# Patient Record
Sex: Female | Born: 1970 | Race: White | Hispanic: No | Marital: Married | Smoking: Former smoker
Health system: Southern US, Community
[De-identification: ages and names within clinical notes are randomized; demographics above are authoritative.]

## PROBLEM LIST (undated history)

## (undated) DIAGNOSIS — K219 Gastro-esophageal reflux disease without esophagitis: Secondary | ICD-10-CM

## (undated) DIAGNOSIS — R519 Headache, unspecified: Secondary | ICD-10-CM

## (undated) DIAGNOSIS — R079 Chest pain, unspecified: Secondary | ICD-10-CM

## (undated) DIAGNOSIS — F419 Anxiety disorder, unspecified: Secondary | ICD-10-CM

## (undated) DIAGNOSIS — R51 Headache: Principal | ICD-10-CM

## (undated) DIAGNOSIS — R0602 Shortness of breath: Secondary | ICD-10-CM

## (undated) HISTORY — DX: Gastro-esophageal reflux disease without esophagitis: K21.9

## (undated) HISTORY — PX: ABDOMINAL HYSTERECTOMY: SHX81

## (undated) HISTORY — DX: Headache: R51

## (undated) HISTORY — DX: Chest pain, unspecified: R07.9

## (undated) HISTORY — DX: Anxiety disorder, unspecified: F41.9

## (undated) HISTORY — DX: Shortness of breath: R06.02

## (undated) HISTORY — DX: Headache, unspecified: R51.9

---

## 1997-06-11 ENCOUNTER — Emergency Department (HOSPITAL_COMMUNITY): Admission: EM | Admit: 1997-06-11 | Discharge: 1997-06-11 | Payer: Self-pay | Admitting: Emergency Medicine

## 1997-07-02 ENCOUNTER — Other Ambulatory Visit: Admission: RE | Admit: 1997-07-02 | Discharge: 1997-07-02 | Payer: Self-pay | Admitting: Gynecology

## 1997-07-06 ENCOUNTER — Other Ambulatory Visit: Admission: RE | Admit: 1997-07-06 | Discharge: 1997-07-06 | Payer: Self-pay | Admitting: Obstetrics and Gynecology

## 1997-09-28 ENCOUNTER — Ambulatory Visit (HOSPITAL_COMMUNITY): Admission: RE | Admit: 1997-09-28 | Discharge: 1997-09-28 | Payer: Self-pay | Admitting: Obstetrics and Gynecology

## 1998-06-16 ENCOUNTER — Other Ambulatory Visit: Admission: RE | Admit: 1998-06-16 | Discharge: 1998-06-16 | Payer: Self-pay | Admitting: *Deleted

## 1998-09-14 ENCOUNTER — Observation Stay (HOSPITAL_COMMUNITY): Admission: RE | Admit: 1998-09-14 | Discharge: 1998-09-15 | Payer: Self-pay | Admitting: *Deleted

## 1998-09-14 ENCOUNTER — Encounter (INDEPENDENT_AMBULATORY_CARE_PROVIDER_SITE_OTHER): Payer: Self-pay | Admitting: Specialist

## 1999-07-11 ENCOUNTER — Other Ambulatory Visit: Admission: RE | Admit: 1999-07-11 | Discharge: 1999-07-11 | Payer: Self-pay | Admitting: *Deleted

## 2000-05-09 ENCOUNTER — Ambulatory Visit (HOSPITAL_BASED_OUTPATIENT_CLINIC_OR_DEPARTMENT_OTHER): Admission: RE | Admit: 2000-05-09 | Discharge: 2000-05-09 | Payer: Self-pay | Admitting: Urology

## 2000-07-22 ENCOUNTER — Encounter: Payer: Self-pay | Admitting: Urology

## 2000-07-22 ENCOUNTER — Ambulatory Visit (HOSPITAL_COMMUNITY): Admission: RE | Admit: 2000-07-22 | Discharge: 2000-07-22 | Payer: Self-pay | Admitting: Urology

## 2000-08-01 ENCOUNTER — Ambulatory Visit (HOSPITAL_BASED_OUTPATIENT_CLINIC_OR_DEPARTMENT_OTHER): Admission: RE | Admit: 2000-08-01 | Discharge: 2000-08-01 | Payer: Self-pay | Admitting: Urology

## 2000-10-28 ENCOUNTER — Other Ambulatory Visit: Admission: RE | Admit: 2000-10-28 | Discharge: 2000-10-28 | Payer: Self-pay | Admitting: *Deleted

## 2002-09-02 ENCOUNTER — Other Ambulatory Visit: Admission: RE | Admit: 2002-09-02 | Discharge: 2002-09-02 | Payer: Self-pay | Admitting: *Deleted

## 2003-09-29 ENCOUNTER — Other Ambulatory Visit: Admission: RE | Admit: 2003-09-29 | Discharge: 2003-09-29 | Payer: Self-pay | Admitting: *Deleted

## 2004-09-25 ENCOUNTER — Other Ambulatory Visit: Admission: RE | Admit: 2004-09-25 | Discharge: 2004-09-25 | Payer: Self-pay | Admitting: *Deleted

## 2004-11-10 ENCOUNTER — Ambulatory Visit: Admission: RE | Admit: 2004-11-10 | Discharge: 2004-11-10 | Payer: Self-pay | Admitting: Gynecology

## 2005-01-30 ENCOUNTER — Ambulatory Visit: Admission: RE | Admit: 2005-01-30 | Discharge: 2005-01-30 | Payer: Self-pay | Admitting: Gynecology

## 2007-03-24 ENCOUNTER — Other Ambulatory Visit: Admission: RE | Admit: 2007-03-24 | Discharge: 2007-03-24 | Payer: Self-pay | Admitting: *Deleted

## 2007-04-01 ENCOUNTER — Encounter: Admission: RE | Admit: 2007-04-01 | Discharge: 2007-04-01 | Payer: Self-pay | Admitting: *Deleted

## 2007-08-29 ENCOUNTER — Encounter: Payer: Self-pay | Admitting: Gynecology

## 2007-08-29 ENCOUNTER — Other Ambulatory Visit: Admission: RE | Admit: 2007-08-29 | Discharge: 2007-08-29 | Payer: Self-pay | Admitting: Gynecology

## 2007-08-29 ENCOUNTER — Ambulatory Visit: Admission: RE | Admit: 2007-08-29 | Discharge: 2007-08-29 | Payer: Self-pay | Admitting: Gynecology

## 2007-11-21 ENCOUNTER — Ambulatory Visit (HOSPITAL_BASED_OUTPATIENT_CLINIC_OR_DEPARTMENT_OTHER): Admission: RE | Admit: 2007-11-21 | Discharge: 2007-11-21 | Payer: Self-pay | Admitting: Urology

## 2008-02-18 ENCOUNTER — Other Ambulatory Visit: Admission: RE | Admit: 2008-02-18 | Discharge: 2008-02-18 | Payer: Self-pay | Admitting: Gynecology

## 2008-02-18 ENCOUNTER — Encounter: Payer: Self-pay | Admitting: Gynecology

## 2008-02-18 ENCOUNTER — Ambulatory Visit: Admission: RE | Admit: 2008-02-18 | Discharge: 2008-02-18 | Payer: Self-pay | Admitting: Gynecology

## 2009-07-14 ENCOUNTER — Encounter: Admission: RE | Admit: 2009-07-14 | Discharge: 2009-07-14 | Payer: Self-pay | Admitting: Gynecology

## 2010-05-05 ENCOUNTER — Ambulatory Visit (HOSPITAL_BASED_OUTPATIENT_CLINIC_OR_DEPARTMENT_OTHER)
Admission: RE | Admit: 2010-05-05 | Discharge: 2010-05-06 | Disposition: A | Discharge status: Home / Self Care | Payer: 59 | Source: Ambulatory Visit | Attending: Urology | Admitting: Urology

## 2010-05-05 DIAGNOSIS — Z8541 Personal history of malignant neoplasm of cervix uteri: Secondary | ICD-10-CM | POA: Insufficient documentation

## 2010-05-05 DIAGNOSIS — N3010 Interstitial cystitis (chronic) without hematuria: Secondary | ICD-10-CM | POA: Insufficient documentation

## 2010-05-05 DIAGNOSIS — Z90710 Acquired absence of both cervix and uterus: Secondary | ICD-10-CM | POA: Insufficient documentation

## 2010-05-05 DIAGNOSIS — Z01812 Encounter for preprocedural laboratory examination: Secondary | ICD-10-CM | POA: Insufficient documentation

## 2010-05-05 DIAGNOSIS — N393 Stress incontinence (female) (male): Secondary | ICD-10-CM | POA: Insufficient documentation

## 2010-05-05 LAB — POCT I-STAT 4, (NA,K, GLUC, HGB,HCT)
Glucose, Bld: 87 mg/dL (ref 70–99)
HCT: 42 % (ref 36.0–46.0)
Hemoglobin: 14.3 g/dL (ref 12.0–15.0)
Potassium: 3.8 mEq/L (ref 3.5–5.1)
Sodium: 141 mEq/L (ref 135–145)

## 2010-05-16 NOTE — Op Note (Signed)
NAME:  Jenna Chapman, Jenna Chapman                ACCOUNT NO.:  [id number]  MEDICAL RECORD NO.:  [id number]          PATIENT TYPE:  LOCATION:                                 FACILITY:  PHYSICIAN:  Zeb Rawl I. Patsi Sears, M.D. DATE OF BIRTH:  DATE OF PROCEDURE: DATE OF DISCHARGE:                              OPERATIVE REPORT   PREOPERATIVE DIAGNOSES:  Stress incontinence and interstitial cystitis.  POSTOPERATIVE DIAGNOSES:  Stress incontinence and interstitial cystitis.  OPERATIONS:  Cystourethroscopy, hydrodistention of the bladder (950 cc) with insertion of Pyridium and Marcaine in the bladder, injection of Marcaine into the subtrigonal space, and stress urinary incontinence treated with [place] pubovaginal sling.  SURGEON:  Shlok Raz I. Patsi Sears, M.D.  ANESTHESIA:  General LMA.  PREPARATION:  After appropriate preanesthesia, the patient was brought to the operating room and placed on the operating room table in the dorsal supine position where general LMA anesthesia was induced.  REVIEW OF HISTORY:  This 40 year old single female who has a combination of interstitial cystitis and stress urinary incontinence.  She is para 2- 2-0, wearing three pads per day.  She is status post hysterectomy in 2005 for cervical cancer.  She has had __________ in the past in 2009 for pelvic pain syndrome.  PROCEDURE IN DETAIL:  Cystoscopy was accomplished and note that the bladder holds 950 cc.  Cystoscopy again shows no evidence of severe ulcerations.  Marcaine and Pyridium were inserted into the bladder. Marcaine plain, 10 cc 0.25 was injected into the bladder base.  Following this, the patient undergoes injection of Marcaine with epinephrine diluted with normal saline, for hydrodissection, in the midline of the urethra, which was marked with a blue pen.  The bladder neck was marked with a blue pen as well to ensure that the sling was placed in the mid urethral area.  Again, two areas were marked  two fingerbreadths above and lateral to the pubic symphysis.  Incisions were made both in the area marked above the pubic symphysis, and in the mid urethra.  Subcutaneous tissue was dissected with the Strully scissors in the periurethral area.  Using the [facility] sling needles, the needles were placed in the retropubic fashion.  Cystoscopy reveals that the right needle was into the bladder, and this was removed, replaced, and repeat cystoscopy shows excellent placement with both the 30-degrees and the 70- degrees lenses.  Following this, the [place] sling was placed retropubically without difficulty.  A right-angle clamp was used for tensioning purposes, and the sleeves were removed, and the mesh was then cut in the subcutaneous area.  The patient tolerated the procedure well. The vaginal incision was closed with running 3-0 Vicryl suture.  The patient tolerated the procedure well.  Foley catheter remains, and vaginal packing was placed.  She was awakened and taken to the recovery room in good condition.  She was given 30 mg of IV Toradol at the end of procedure.  She received a B and O suppository at the beginning of procedure.     Laporsha Grealish I. Patsi Sears, M.D.     SIT/MEDQ  D:  05/05/2010  T:  05/05/2010  Job:  [id number]  cc:   Billee Cashing, M.D.  Electronically Signed by Jethro Bolus M.D. on 05/16/2010 05:10:27 PM

## 2010-05-16 NOTE — Consult Note (Signed)
NAMEWILENA, Jenna Chapman              ACCOUNT NO.:  [id number]   MEDICAL RECORD NO.:  [id number]          PATIENT TYPE:  OUT   LOCATION:  GYN                          FACILITY:  [facility]   PHYSICIAN:  De Blanch, M.D.DATE OF BIRTH:  August 20, 1970   DATE OF CONSULTATION:  08/29/2007  DATE OF DISCHARGE:  08/29/2007                                 CONSULTATION   CHIEF COMPLAINT:  Bladder dysfunction, pelvic pain, constipation.   HISTORY OF PRESENT ILLNESS:  This 40 year old white female returns  having last been seen in our clinic in January 2007.  The reason she has  not returned is unclear.  Today she has number of complaints including  1. Bladder dysfunction.  She does not have good bladder sensation and      does not feel like she empties her bladder completely.  She denies      any recent urinary tract infections or any pyelonephritis.  2. Constipation.  The patient reports that she has a bowel movement      approximately once a month.  She has not been taking any laxatives      and does not seem to be particularly concerned about this problem.  3. Pelvic pain.  The patient reports that she has discomfort and pain      when she has intercourse.  She comes accompanied by her female      partner, who confirms.   HISTORY OF PRESENT ILLNESS:  The patient underwent a radical  trachelectomy and bilateral pelvic lymphadenectomy December 2006.  She  had an adenocarcinoma of the cervical stump.  Final pathology showed no  residual disease and all lymph nodes were negative.  The patient had an  uncomplicated postoperative course and her suprapubic catheter was  removed approximately a week after she began clamp and release.   PAST MEDICAL HISTORY:  Medical illnesses - none.   CURRENT MEDICATIONS:  None.   DRUG ALLERGIES:  None.   PAST SURGICAL HISTORY:  1. Tubal ligation.  2. Diagnostic laparoscopy.  3. Hysterectomy.  4. Radical trachelectomy.  5. Pelvic lymphadenectomy.   SOCIAL  HISTORY:  The patient is a Production designer, theatre/television/film at [employer].  She lives  with her mother.   FAMILY HISTORY:  Sister with cervical cancer.   REVIEW OF SYSTEMS:  A 10-point comprehensive review of systems negative  except as noted above.   PHYSICAL EXAMINATION:  VITAL SIGNS:  Weight 172 pounds, blood pressure  120/70, pulse 80, respiratory 20.  GENERAL:  Patient is a healthy white female in no acute distress.  HEENT:  Negative.  NECK:  Supple without thyromegaly.  There is no supraclavicular or  inguinal adenopathy.  ABDOMEN:  Soft, nontender.  No masses, organomegaly, ascites or hernias  noted.  PELVIC:  EG, BUS, vagina, bladder and urethra are normal.  Vagina  appears very normal but slightly atrophic.  No lesions are noted.  Bimanual and rectovaginal exam reveal no mass, induration or nodularity.   IMPRESSION:  Adenocarcinoma of the cervix, clinically free of disease.  Pap smears were obtained.   Bladder dysfunction, most likely secondary to her radical  hysterectomy.  She has previous seen Dr. Brunilda Payor and I would suggest the patient return to  see him for further evaluation.  After evaluation, it may be necessary  that she learn how to perform intermittent self-catheterization.  On the  other hand, there may be medications that Dr. Brunilda Payor could prescribe to  help her with her bladder function.   Chronic constipation.  It is amazing to me that the patient admits that  she only has a bowel movement once a month.  We have advised her to  increase fiber and fluids in her diet and also to use a mild laxative  such as MiraLax or Milk of Magnesia and to not but more than 4-5 days  ago between having a bowel movement.  Certainly this could be  contributing significantly to her abdominal and pelvic discomfort.   She does have some vaginal atrophy and therefore is given a prescription  for Premarin vaginal cream.  In addition, she says she is having severe  hot flashes and she is therefore given a  prescription for Premarin 0.9  mg p.o. daily.   The patient will return to see me in 6 months for continuing follow-up.      De Blanch, M.D.  Electronically Signed     DC/MEDQ  D:  09/01/2007  T:  09/01/2007  Job:  [id number]   cc:   Telford Nab, R.N.  [address]  [place], [place] [address]   Lindaann Slough, M.D.  Fax: [phone]   Leatha Gilding. Mezer, M.D.  Fax: [phone]

## 2010-05-16 NOTE — Op Note (Signed)
Jenna Chapman, Jenna Chapman              ACCOUNT NO.:  [id number]   MEDICAL RECORD NO.:  [id number]          PATIENT TYPE:  AMB   LOCATION:  [facility]                         FACILITY:  [facility]   PHYSICIAN:  Lindaann Slough, M.D.  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   PROCEDURE:  Cystoscopy HOG urethral dilation and bladder instillation of  Marcaine and Pyridium.   SURGEON:  Dr. Brunilda Payor.   ANESTHESIA:  General.   The patient was identified by her wrist band.  Proper time out was then  taken.   INDICATION:  The patient is a 40 year old female who had been  complaining of frequency, straining on urination, suprapubic discomfort,  sensation of incomplete emptying of the bladder and fullness in the  suprapubic area.  She was treated with Flomax, Estrace cream, and she  has not had any improvement.  She needs cystoscopy for further  evaluation.   Under general anesthesia, the patient was prepped and draped and placed  in the dorsal lithotomy position.  A panendoscope was inserted in the  bladder.  The bladder mucosa is normal.  There is no stone or tumor in  the bladder.  The ureteral orifices are in normal position and shape.  There is no evidence of submucosal hemorrhage.  The bladder was then  distended for about 5 minutes under 36 cm of water pressure.  The  bladder capacity is about 800 mL.  The cystoscope was then removed.  The  urethra was dilated up to #32-French.  Then, 400 mg of Pyridium in 15 mL  of 0.5% Marcaine were instilled in the bladder.   The patient tolerated the procedure well and left the OR in satisfactory  condition to post-anesthesia care unit.      Lindaann Slough, M.D.  Electronically Signed     MN/MEDQ  D:  11/21/2007  T:  11/21/2007  Job:   [id number]

## 2010-05-16 NOTE — Consult Note (Signed)
[name], Chapman              ACCOUNT NO.:  [id number]   MEDICAL RECORD NO.:  [id number]          PATIENT TYPE:  OUT   LOCATION:  GYN                          FACILITY:  [facility]   PHYSICIAN:  De Blanch, M.D.DATE OF BIRTH:  1970-10-24   DATE OF CONSULTATION:  02/18/2008  DATE OF DISCHARGE:                                 CONSULTATION   REFERRING PHYSICIAN:  Lindaann Slough, M.D.   CHIEF COMPLAINT:  Cervical cancer, continuing bladder dysfunction,  menopausal symptoms.   INTERVAL HISTORY:  The patient returns today as regularly scheduled for  followup.  At her last visit we undertook several interventions.  I  prescribed Premarin cream, which is helping the patient with vaginal  function.  I also gave her a Premarin prescription for 0.9 mg.  She  reports she is still having hot flashes, night sweats and irritability.   The patient is less constipated with a more aggressive dietary regimen,  but still claims she has a bowel movement only about 3 times a month.   With regard to her bladder dysfunction.  She tells me she saw Dr. Brunilda Payor  who did urethral dilatation.  Nonetheless, she continues to feel as if  her bladder is not emptying.  She indicates that Dr. Brunilda Payor evaluated her  with a bladder scan, and showed that she was having good emptying of the  bladder with no residual.  The patient has had no urinary tract  infections in the intervening 6 months.   HISTORY OF PRESENT ILLNESS:  The patient underwent a radical  trachelectomy and bilateral pelvic lymphadenectomy December 2006, for  adenocarcinoma of the cervical stump (the patient previously had a  supracervical hysterectomy).  Final pathology showed no residual disease  and all lymph nodes were negative.   PAST MEDICAL HISTORY:  Medical illnesses none.   CURRENT MEDICATIONS:  1. Premarin 0.9 mg daily.  2. Premarin cream.   DRUG ALLERGIES:  None.   PAST SURGICAL HISTORY:  1. Tubal ligation.  2. Diagnostic  laparoscopy.  3. Hysterectomy with bilateral salpingo-oophorectomy.  4. Radical trachelectomy.  5. Pelvic lymphadenectomy.   SOCIAL HISTORY:  The patient is a Production designer, theatre/television/film at [employer].  She lives  with her mother.   FAMILY HISTORY:  Sister with cervical cancer.   REVIEW OF SYSTEMS:  A 10-point comprehensive review of systems negative,  except as noted above.   PHYSICAL EXAMINATION:  Weight 190 pounds.  GENERAL:  The patient is a pleasant white female in no acute distress.  HEENT:  Negative.  NECK:  Supple without thyromegaly.  There is no supraclavicular or  inguinal adenopathy.  ABDOMEN:  Soft and nontender.  No mass, organomegaly, ascites or hernias  noted.  PELVIC EXAM:  EG BUS, vagina, bladder and urethra are normal.  Cervix and uterus are surgically absent.  Adnexa without masses.  Rectovaginal exam confirms.  EXTREMITIES:  Lower extremities without edema or varicosities.   IMPRESSION:  1. Cervical cancer.  No evidence of recurrent disease.  Pap smears      were obtained.  2. Continuing bladder dysfunction.  I have asked the patient to  return      to see Dr. Brunilda Payor for further evaluation.  She does indicate she has      some stress incontinence and what sounds like urge incontinence as      well.  3. With regard to her menopausal symptoms, we will increase the dose      of her hormone replacement therapy to a dose of Premarin 1.25 mg      daily.  She will continue using Premarin vaginal cream for sexual      function.  4. The patient complains of some peripheral edema in her hands and      feet, and was given a prescription for Aldactazide 25 mg, to be      taken one tablet daily p.r.n. fluid retention.  She will return to      see me in 6 months for continuing followups.      De Blanch, M.D.  Electronically Signed     DC/MEDQ  D:  02/18/2008  T:  02/18/2008  Job:  [id number]   cc:   Telford Nab, R.N.  [address]  [place], [place] [address]    Lindaann Slough, M.D.  Fax: [phone]

## 2010-05-19 NOTE — Consult Note (Signed)
NAMELIBERTA, Jenna Chapman              ACCOUNT NO.:  [id number]   MEDICAL RECORD NO.:  [id number]          PATIENT TYPE:  OUT   LOCATION:  GYN                          [facility]ACILITY:  [facility]   PHYSICIAN:  De Blanch, M.D.DATE OF BIRTH:  1970-12-02   DATE OF CONSULTATION:  11/10/2004  DATE OF DISCHARGE:                                   CONSULTATION   REFERRING PHYSICIAN:  Almedia Balls. Fore, M.D.   CHIEF COMPLAINT:  Cervical cancer.   HISTORY OF PRESENT ILLNESS:  The patient recently was found to have a Pap  smear showing atypical glandular cells and subsequently underwent a LEEP  procedure on October 20, 2004. She was found to have extensive  adenocarcinoma in both the anterior and posterior fragments of the cervix  and involvement of the endocervical margin. The depth of invasion was only  approximately 1 mm, but the extent of horizontal spread was at least 8 mm.  There was some villoglandular architecture and endometrioid appearance.  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   The patient has an interesting past gynecological history in that she  underwent a laparoscopically assisted super cervical hysterectomy and  bilateral salpingo-oophorectomy on September 14, 1998 for treatment of  severe pelvic pain, dyspareunia with findings of endometriosis. The cervix  was left in situ at the patient's request. Prior to that time, she had not  had abnormal Pap smears.   PAST MEDICAL HISTORY:  Medical illnesses:  None.   PAST SURGICAL HISTORY:  Laparoscopically assisted super cervical vaginal  hysterectomy and bilateral salpingo-oophorectomy, bilateral tubal ligation.   DRUG ALLERGIES:  None.   CURRENT MEDICATIONS:  Estradiol.   FAMILY HISTORY:  The patient has a sister and two aunts who apparently had  cervix cancer.   SOCIAL HISTORY:  The patient is divorced. She comes accompanied by her  boyfriend today. She smokes one pack per day and  works as a Production designer, theatre/television/film at [employer].   OBSTETRICAL HISTORY:  Gravida 2. She has 62 and 3 year old children.   REVIEW OF SYSTEMS:  Ten-point comprehensive review of systems negative  except for notation that she does have some vaginal discharge and spotting  at the present time. She also notes that the Estradiol is not really  sufficiently holding her hot flushes and night sweats in check.   PHYSICAL EXAMINATION:  VITAL SIGNS:  Height 5 foot 7, weight 158 pounds.  Blood pressure 118/74, pulse 92.  GENERAL:  The patient is a healthy, white female in no acute distress.  HEENT:  Negative.  NECK:  Supple without thyromegaly. There is no supraclavicular or inguinal  adenopathy.  ABDOMEN:  Soft and nontender with no masses, organomegaly, ascites, or  hernias noted.  PELVIC:  EGBUS, vagina, bladder, and urethra are normal. The cervix is  healing following a LEEP procedure. No gross lesions are noted. On bimanual  examination, the cervix is very mobile. I do not feel any paravaginal or  parametrial disease. Pelvic side walls also appear and palpate normally.  EXTREMITIES:  Lower extremities without edema or varicosities.  IMPRESSION:  Well-differentiated endocervical adenocarcinoma which is quite  extensive although superficially invasive. I had a lengthy discussion with  the patient and her boyfriend regarding management options which include  either primary radiation therapy or primary radical surgery. After  discussing both and emphasizing that both have equally good cancer outcomes,  the patient wishes to proceed with a radical trachelectomy and pelvic  lymphadenectomy. The risks of this surgery including hemorrhage, infection,  injury to adjacent viscera, and fistulization were discussed. We also  pointed out the probability of some bladder dysfunction following surgery  and the need for a suprapubic catheter postoperatively. The patient  expressed an understanding of all  of these risks, and all of her questions  were answered. I would like to have her cervix heal adequately before  undertaking the radial hysterectomy. In looking at schedules, it appears my  next available day in [place] is December 19. On the other hand, the  patient would prefer to have surgery at [facility] on December 12. We  will make those arrangements.      De Blanch, M.D.  Electronically Signed     DC/MEDQ  D:  11/10/2004  T:  11/11/2004  Job:  [id number]   cc:   Almedia Balls. Randell Patient, M.D.  Fax: [phone]   Telford Nab, R.N.  [address]  [place], [place] [address]

## 2010-05-19 NOTE — Op Note (Signed)
[place]  Patient:    Jenna Chapman, Jenna Chapman                     MRN: [id number] Proc. Date: 05/09/00 Adm. Date:  [id number] Attending:  Lindaann Slough CC:         Almedia Balls. Randell Patient, M.D.   Operative Report  PREOPERATIVE DIAGNOSIS:  Meatal stenosis.  POSTOPERATIVE DIAGNOSIS:  Meatal stenosis.  PROCEDURE:  Cystoscopy urethral dilation.  SURGEON:  Lindaann Slough, M.D.  ANESTHESIA:  General.  INDICATIONS:  The patient is a 40 year old female who has been complaining on and off for the past 2 years of frequency, ______ pain, and pain during intercourse.  She was treated with antibiotics without any improvement.  She is scheduled today for cystoscopy.  DESCRIPTION OF PROCEDURE:  Under general anesthesia, the patient was prepped and draped, and placed in the dorsal lithotomy position.  A #21 Wappler cystoscope was inserted in the bladder.  The bladder mucosa is normal.  There is no stone or tumor in the bladder.  The ureteral orifices are in normal position and shape with clear efflux.  The bladder was then emptied.  There was no evidence of submucosal hemorrhage after refilling the bladder.  The bladder was kept dilated for 10 minutes with 400 cc of fluid.  The cystoscope was then removed.  The urethra was dilated with #32 [place].  The patient tolerated the procedure well and left the OR in satisfactory condition to post anesthesia care unit. DD:  05/09/00 TD:  05/09/00 Job: [id number]

## 2010-05-19 NOTE — Op Note (Signed)
[place]  Patient:    Jenna Chapman, Jenna Chapman                     MRN: [id number] Proc. Date: 08/01/00 Adm. Date:  [id number] Disc. Date: [id number] Attending:  Lindaann Slough CC:         Almedia Balls. Randell Patient, M.D.   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  OPERATION:  Cystoscopy and urethral dilation.  SURGEON:  Lindaann Slough, M.D.  ANESTHESIA:  General  INDICATION FOR PROCEDURE:  The patient is a 40 year old female who has been complaining of dysuria and pain during intercourse.  She was treated with antibiotics without any improvement.  VCUG showed no diverticula or reflux. She was evaluated by her gynecologist, who found no evidence of gynecologic pathology.  She is scheduled today for cystoscopy.  DESCRIPTION OF PROCEDURE:  Under general anesthesia, the patient was prepped and draped and placed in the dorsal lithotomy position.  A 22 Wappler cystoscope was inserted in the bladder.  The bladder mucosa is normal.  There is no stone or tumor in the bladder.  The ureteral orifices are in normal position and shape with clear efflux.  There is evidence of trigonitis.  There was no evidence of submucosal hemorrhage.  The cystoscope was then removed. The urethra was dilated with #32 [place].  Bimanual examination showed no evidence of pelvic mass.  The cervix is firm and in the midline.  The patient tolerated the procedure well and left the operating room in satisfactory condition to postanesthesia care unit. DD:  08/01/00 TD:  08/01/00 Job: [id number]

## 2010-05-19 NOTE — Consult Note (Signed)
Jenna Chapman, Jenna Chapman              ACCOUNT NO.:  [id number]   MEDICAL RECORD NO.:  [id number]          PATIENT TYPE:  OUT   LOCATION:  GYN                          FACILITY:  [facility]   PHYSICIAN:  De Blanch, M.D.DATE OF BIRTH:  1970-02-02   DATE OF CONSULTATION:  01/30/2005  DATE OF DISCHARGE:                                   CONSULTATION   CHIEF COMPLAINT:  Postoperative followup.   REASON FOR EVALUATION:  This 40 year old white female returns for followup,  having undergone a radical trachelectomy and bilateral pelvis  lymphadenectomy, December 12, 2004, for an adenocarcinoma of the cervical  stump.  Final pathology showed no residual disease and all lymph nodes were  negative.  The patient had an uncomplicated postoperative course.  Her  suprapubic catheter was removed approximately a week after she began clamp-  and-release.   Today she notes that she is having some urinary urgency.  She does have good  sensation of her bladder.  She feels she has some hesitancy as well.  She  denies any pelvic pain or pressure, functional status is increasing and she  has already returned to work.   PHYSICAL EXAMINATION:  VITAL SIGNS:  Weight 161 pounds.  GENERAL:  The patient is a healthy white female in no acute distress.  HEENT:  Negative.  NECK:  Supple without thyromegaly.  ABDOMEN:  Soft and nontender.  No masses, organomegaly, ascites or hernias  are noted.  Pfannenstiel skin incision is well-healed.  PELVIC:  EG/BUS, vagina, and urethra are normal.  Cervix and uterus are  surgically absent.  The vaginal cuff is healing nicely.  Bimanual and  rectovaginal exam reveal no masses, induration or nodularity.  There is a  minimal amount of postoperative induration.   IMPRESSION:  Good postoperative recovery.  I reviewed the patient's  pathology report with her and would not recommend any additional therapy.   The patient does note she is having a considerable number of hot  flushes.  She has previously had a bilateral salpingo-oophorectomy and is currently  taking estradiol 4 mg daily.  I therefore suggested she increase her  estradiol dose to 6 mg daily.   She was also given a prescription for Detrol LA one tablet daily for the  next month to see if this will help with her urinary incontinence.   She will return to see Dr. Jeanine Luz in 3 months and return to see [place] in 6  months.      De Blanch, M.D.  Electronically Signed     DC/MEDQ  D:  01/30/2005  T:  01/31/2005  Job:  [id number]   cc:   Almedia Balls. Randell Patient, M.D.  Fax: [phone]   Telford Nab, R.N.  [address]  [place], [place] [address]

## 2010-06-22 ENCOUNTER — Ambulatory Visit (HOSPITAL_BASED_OUTPATIENT_CLINIC_OR_DEPARTMENT_OTHER)
Admission: RE | Admit: 2010-06-22 | Discharge: 2010-06-22 | Disposition: A | Discharge status: Home / Self Care | Payer: 59 | Source: Ambulatory Visit | Attending: Urology | Admitting: Urology

## 2010-06-22 DIAGNOSIS — N3010 Interstitial cystitis (chronic) without hematuria: Secondary | ICD-10-CM | POA: Insufficient documentation

## 2010-06-22 DIAGNOSIS — Z01812 Encounter for preprocedural laboratory examination: Secondary | ICD-10-CM | POA: Insufficient documentation

## 2010-06-22 DIAGNOSIS — R339 Retention of urine, unspecified: Secondary | ICD-10-CM | POA: Insufficient documentation

## 2010-06-22 LAB — POCT HEMOGLOBIN-HEMACUE: Hemoglobin: 13.8 g/dL (ref 12.0–15.0)

## 2010-06-23 NOTE — Op Note (Signed)
  NAME:  Jenna Chapman, Jenna Chapman                ACCOUNT NO.:  [id number]  MEDICAL RECORD NO.:  [id number]  LOCATION:                                 FACILITY:  PHYSICIAN:  Pedrohenrique Mcconville I. Patsi Sears, M.D.DATE OF BIRTH:  1970-09-26  DATE OF PROCEDURE:  06/22/2010 DATE OF DISCHARGE:                              OPERATIVE REPORT   PREOPERATIVE DIAGNOSIS:  Urinary retention post Lynx sling.  POSTOPERATIVE DIAGNOSIS:  Urinary retention post Lynx sling.  OPERATION:  Urethral exploration and incision of Lynx sling.  SURGEON:  Tyronica Truxillo I. Patsi Sears, M.D.  ANESTHESIA:  General LMA.  OPERATION:  After appropriate preanesthesia, the patient is brought to the operating room, placed on the operating table in a dorsal supine position where general LMA anesthesia was introduced.  She was then re- placed in dorsal lithotomy position where the pubis was prepped with Betadine solution and draped in usual fashion.  BRIEF HISTORY:  This 40 year old female is status post [place] pubovaginal sling with medical history of interstitial cystitis.  She has had urinary retention since her surgery, unresponsive to Rapaflo.  She is also unresponsive to Urecholine.  She is now for incision of her sling.  PROCEDURE:  With the patient in dorsal lithotomy position, the posterior weighted speculum was placed, and Foley catheter placed and bladder drained of fluid.  Review of the patient's operative note shows that she had usual placement of the Lynx sling in the periurethral position with excellent visualization of the sling.  Following injection of Marcaine and epinephrine into the surgical space, and marking of outline of the surgical incision, a 15-mm incision was made in mid urethra.  Subcutaneous tissue dissected.  The [place] sling was identified in the urethra, and the top of the sling appears to be smooth, but the bottom of sling appeared to be somewhat blanched.  The sling did appear to be tied against the urethra.  I  was able to dissect a right-angle clamp around the back of the sling and incised the sling, with immediate "pop," indicating a release of tension.  I then closed the incision using 2-0 Vicryl suture in single closure.  The patient received IV Toradol, awakened and taken to recovery room in good condition.    Michela Herst I. Patsi Sears, M.D.    SIT/MEDQ  D:  06/22/2010  T:  06/22/2010  Job:  [id number]  Electronically Signed by Jethro Bolus M.D. on 06/23/2010 01:19:55 PM

## 2010-07-31 ENCOUNTER — Other Ambulatory Visit: Payer: Self-pay | Admitting: Gynecology

## 2010-07-31 DIAGNOSIS — R928 Other abnormal and inconclusive findings on diagnostic imaging of breast: Secondary | ICD-10-CM

## 2010-08-11 ENCOUNTER — Ambulatory Visit
Admission: RE | Admit: 2010-08-11 | Discharge: 2010-08-11 | Disposition: A | Discharge status: Home / Self Care | Payer: 59 | Source: Ambulatory Visit | Attending: Gynecology | Admitting: Gynecology

## 2010-08-11 DIAGNOSIS — R928 Other abnormal and inconclusive findings on diagnostic imaging of breast: Secondary | ICD-10-CM

## 2010-08-17 ENCOUNTER — Ambulatory Visit (HOSPITAL_BASED_OUTPATIENT_CLINIC_OR_DEPARTMENT_OTHER)
Admission: RE | Admit: 2010-08-17 | Discharge: 2010-08-17 | Disposition: A | Discharge status: Home / Self Care | Payer: 59 | Source: Ambulatory Visit | Attending: Urology | Admitting: Urology

## 2010-08-17 DIAGNOSIS — N8111 Cystocele, midline: Secondary | ICD-10-CM | POA: Insufficient documentation

## 2010-08-17 DIAGNOSIS — J449 Chronic obstructive pulmonary disease, unspecified: Secondary | ICD-10-CM | POA: Insufficient documentation

## 2010-08-17 DIAGNOSIS — Z79899 Other long term (current) drug therapy: Secondary | ICD-10-CM | POA: Insufficient documentation

## 2010-08-17 DIAGNOSIS — R338 Other retention of urine: Secondary | ICD-10-CM | POA: Insufficient documentation

## 2010-08-17 DIAGNOSIS — Z8541 Personal history of malignant neoplasm of cervix uteri: Secondary | ICD-10-CM | POA: Insufficient documentation

## 2010-08-17 DIAGNOSIS — N393 Stress incontinence (female) (male): Secondary | ICD-10-CM | POA: Insufficient documentation

## 2010-08-17 DIAGNOSIS — J4489 Other specified chronic obstructive pulmonary disease: Secondary | ICD-10-CM | POA: Insufficient documentation

## 2010-08-17 DIAGNOSIS — K219 Gastro-esophageal reflux disease without esophagitis: Secondary | ICD-10-CM | POA: Insufficient documentation

## 2010-08-22 NOTE — Op Note (Signed)
  NAME:  Jenna Chapman, Jenna Chapman NO.:  [id number]  MEDICAL RECORD NO.:  [id number]  LOCATION:                                 FACILITY:  PHYSICIAN:  Weiland Tomich I. Patsi Sears, M.D. DATE OF BIRTH:  DATE OF PROCEDURE: DATE OF DISCHARGE:                              OPERATIVE REPORT   PREOPERATIVE DIAGNOSIS:  Urinary retention.  POSTOPERATIVE DIAGNOSIS:  Urinary retention.  OPERATION:  Midline vaginal incision and urethrolysis and incision of Lynx pubovaginal sling.  SURGEON:  Raksha Wolfgang I. Patsi Sears, MD  ANESTHESIA:  General LMA.  PREOPERATIVE PREPARATION:  After appropriate preanesthesia, the patient was brought to the operating room, placed on the operating table in the dorsal supine position where general LMA anesthesia was introduced.  She was then replaced in dorsal lithotomy position where the pubis was prepped with Betadine solution and draped in usual fashion.  REVIEW OF HISTORY:  The patient is a 40 year old female with urinary retention since [place] sling for urinary stress incontinence.  The patient has failed Rapaflo and Urecholine, has been on intermittent cath.  She is status post incision of Lynx sling in June 2012, but still could not void.  She is now for urethrolysis and urethral dilation.  PROCEDURE IN DETAIL:  The vaginal inspection was accomplished and she has normal extravasation.  There is a grade 1 cystocele present.  The urethra does not appear to be hypermobile, but does not appear to be fixed.  A 5 cc of Marcaine plus epinephrine was injected into the surgical site and incision was made, subcutaneous tissue dissected bilaterally.  Foley catheter remained in place.  Complete urethrolysis was then accomplished with incision of all tissue.  The sling had previously been incised.  Closure was accomplished with 2-0 Vicryl and urethral dilation was accomplished with a size 28-French without difficulty.  The patient tolerated procedure well.  There  was minimal bleeding noted.  The patient was awakened and taken to recovery room in good condition. She received 1 g of IV acetaminophen.     Irmalee Riemenschneider I. Patsi Sears, M.D.    SIT/MEDQ  D:  08/17/2010  T:  08/18/2010  Job:  [id number]  Electronically Signed by Jethro Bolus M.D. on 08/22/2010 05:01:53 PM

## 2010-10-03 LAB — POCT HEMOGLOBIN-HEMACUE: Hemoglobin: 14.5

## 2011-07-24 ENCOUNTER — Other Ambulatory Visit: Payer: Self-pay | Admitting: Gynecology

## 2011-07-24 DIAGNOSIS — N63 Unspecified lump in unspecified breast: Secondary | ICD-10-CM

## 2011-07-26 ENCOUNTER — Other Ambulatory Visit: Payer: 59

## 2011-07-31 ENCOUNTER — Ambulatory Visit
Admission: RE | Admit: 2011-07-31 | Discharge: 2011-07-31 | Disposition: A | Discharge status: Home / Self Care | Payer: 59 | Source: Ambulatory Visit | Attending: Gynecology | Admitting: Gynecology

## 2011-07-31 DIAGNOSIS — N63 Unspecified lump in unspecified breast: Secondary | ICD-10-CM

## 2012-06-27 ENCOUNTER — Emergency Department (HOSPITAL_COMMUNITY)
Admission: EM | Admit: 2012-06-27 | Discharge: 2012-06-27 | Disposition: A | Discharge status: Home / Self Care | Payer: 59 | Attending: Emergency Medicine | Admitting: Emergency Medicine

## 2012-06-27 ENCOUNTER — Emergency Department (HOSPITAL_COMMUNITY): Payer: 59

## 2012-06-27 ENCOUNTER — Encounter (HOSPITAL_COMMUNITY): Payer: Self-pay | Admitting: Emergency Medicine

## 2012-06-27 DIAGNOSIS — R110 Nausea: Secondary | ICD-10-CM | POA: Insufficient documentation

## 2012-06-27 DIAGNOSIS — F17200 Nicotine dependence, unspecified, uncomplicated: Secondary | ICD-10-CM | POA: Insufficient documentation

## 2012-06-27 DIAGNOSIS — I6200 Nontraumatic subdural hemorrhage, unspecified: Secondary | ICD-10-CM

## 2012-06-27 LAB — CBC WITH DIFFERENTIAL/PLATELET
Basophils Absolute: 0 10*3/uL (ref 0.0–0.1)
Eosinophils Relative: 0 % (ref 0–5)
Lymphocytes Relative: 15 % (ref 12–46)
Lymphs Abs: 2.3 10*3/uL (ref 0.7–4.0)
MCV: 92.1 fL (ref 78.0–100.0)
Neutro Abs: 12.5 10*3/uL — ABNORMAL HIGH (ref 1.7–7.7)
Platelets: 177 10*3/uL (ref 150–400)
RBC: 4.19 MIL/uL (ref 3.87–5.11)
RDW: 13.2 % (ref 11.5–15.5)
WBC: 15.4 10*3/uL — ABNORMAL HIGH (ref 4.0–10.5)

## 2012-06-27 LAB — POCT I-STAT, CHEM 8
Creatinine, Ser: 1 mg/dL (ref 0.50–1.10)
HCT: 39 % (ref 36.0–46.0)
Hemoglobin: 13.3 g/dL (ref 12.0–15.0)
Potassium: 4.5 mEq/L (ref 3.5–5.1)
Sodium: 142 mEq/L (ref 135–145)
TCO2: 25 mmol/L (ref 0–100)

## 2012-06-27 LAB — APTT: aPTT: 29 seconds (ref 24–37)

## 2012-06-27 LAB — PROTIME-INR
INR: 1.03 (ref 0.00–1.49)
Prothrombin Time: 13.3 seconds (ref 11.6–15.2)

## 2012-06-27 MED ORDER — HYDROCODONE-ACETAMINOPHEN 5-325 MG PO TABS: 1.0000 | ORAL_TABLET | Freq: Four times a day (QID) | ORAL | Status: DC | PRN

## 2012-06-27 MED ORDER — OXYCODONE-ACETAMINOPHEN 5-325 MG PO TABS: 1.0000 | ORAL_TABLET | Freq: Four times a day (QID) | ORAL | Status: DC | PRN

## 2012-06-27 NOTE — ED Provider Notes (Signed)
History    CSN: [id number] Arrival date & time 06/27/12  1550  First MD Initiated Contact with Patient 06/27/12 1626     Chief Complaint  Patient presents with  . Headache   (Consider location/radiation/quality/duration/timing/severity/associated sxs/prior Treatment) HPI Comments: Due to headache that started suddenly and has been maximum intensity since it started 7 days ago pt [facility] and Radiology called pt and told her to come to ED due to bleeding in the brain.  Patient is a 42 y.o. female presenting with headaches. The history is provided by the patient.  Headache Pain location:  Occipital Quality:  Sharp and stabbing Radiates to:  Does not radiate Severity currently:  10/10 Severity at highest:  10/10 Onset quality:  Sudden Duration:  7 days Timing:  Constant Progression:  Unchanged Chronicity:  New Similar to prior headaches: no   Context comment:  Nothing seems to make it worse Relieved by: goody's powders and then comes right back. Ineffective treatments:  None tried Associated symptoms: nausea   Associated symptoms: no abdominal pain, no blurred vision, no cough, no pain, no facial pain, no fever, no focal weakness, no loss of balance, no neck pain, no numbness, no paresthesias, no photophobia, no sinus pressure, no URI, no visual change, no vomiting and no weakness    History reviewed. No pertinent past medical history. Past Surgical History  Procedure Laterality Date  . Abdominal hysterectomy     No family history on file. History  Substance Use Topics  . Smoking status: Current Every Day Smoker  . Smokeless tobacco: Not on file  . Alcohol Use: No   OB History   Grav Para Term Preterm Abortions TAB SAB Ect Mult Living                 Review of Systems  Constitutional: Negative for fever.  HENT: Negative for neck pain and sinus pressure.   Eyes: Negative for blurred vision, photophobia and pain.  Respiratory: Negative for cough.    Gastrointestinal: Positive for nausea. Negative for vomiting and abdominal pain.  Neurological: Positive for headaches. Negative for focal weakness, numbness, paresthesias and loss of balance.  All other systems reviewed and are negative.    Allergies  Review of patient's allergies indicates no known allergies.  Home Medications  No current outpatient prescriptions on file. BP 136/82  Pulse 101  Temp(Src) 97.5 F (36.4 C)  Resp 16  SpO2 97% Physical Exam  Nursing note and vitals reviewed. Constitutional: She is oriented to person, place, and time. She appears well-developed and well-nourished. No distress.  HENT:  Head: Normocephalic and atraumatic.  Right Ear: Tympanic membrane and ear canal normal.  Left Ear: Tympanic membrane and ear canal normal.  Mouth/Throat: Oropharynx is clear and moist.  Eyes: Conjunctivae and EOM are normal. Pupils are equal, round, and reactive to light.  Neck: Normal range of motion. Neck supple. No spinous process tenderness and no muscular tenderness present.  Cardiovascular: Normal rate, regular rhythm and intact distal pulses.   No murmur heard. Pulmonary/Chest: Effort normal and breath sounds normal. No respiratory distress. She has no wheezes. She has no rales.  Abdominal: Soft. She exhibits no distension. There is no tenderness. There is no rebound and no guarding.  Musculoskeletal: Normal range of motion. She exhibits no edema and no tenderness.  Neurological: She is alert and oriented to person, place, and time. She has normal strength. No cranial nerve deficit or sensory deficit. Coordination and gait normal.  No photophobia or heminopsia  Skin: Skin is warm and dry. No rash noted. No erythema.  Psychiatric: She has a normal mood and affect. Her behavior is normal.    ED Course  Procedures (including critical care time) Labs Reviewed  CBC WITH DIFFERENTIAL - Abnormal; Notable for the following:    WBC 15.4 (*)    Neutrophils Relative  % 81 (*)    Neutro Abs 12.5 (*)    All other components within normal limits  POCT I-STAT, CHEM 8 - Abnormal; Notable for the following:    Glucose, Bld 184 (*)    All other components within normal limits  PROTIME-INR  APTT   Ct Head Wo Contrast  06/27/2012   *RADIOLOGY REPORT*  Clinical Data: Headache. The patient reportedly told to go to the emergency room after intracranial hemorrhage was seen on an outside MRI examination.  CT HEAD WITHOUT CONTRAST  Technique:  Contiguous axial images were obtained from the base of the skull through the vertex without contrast.  Comparison: None.  Findings: Neither the prior outside MRI exam or its report is available to me at the time of interpretation.  Subtle asymmetric right frontal - temporal - parietal density favoring subdural hematoma measures up to 4 mm in thickness, potentially with 3 mm of right-to-left midline shift.  The finding is most appreciable using a window levels setting of approximately 200/75.  The brain stem, cerebellum, cerebral peduncles, thalami, basal ganglia, basilar cisterns, and ventricular system appear unremarkable.  No mass lesion or acute CVA observed.  IMPRESSION:  1.  Subtle 3-4 mm thick right frontal-temporal-parietal subdural hematoma.  If the report were images from prior MRI can be provided, we are happy to issue an addendum with comparison.  There is up to 3 mm of right-to-left midline shift.   Original Report Authenticated By: Gaylyn Rong, M.D.   No diagnosis found.  MDM   A she presented due to having an MRI today showing a right-sided subdural hematoma with approximately 5 mm leftward midline shift. Patient states that she developed an acute 10 of 10 headache approximately one week ago and the headache has been constant until now. She denies any neurologic compromise and has normal mental status and normal gait. She has no focal findings on exam. Her only complaint is of headache and nausea. She states that she's  been taking goodies powders daily since the headache started and I will relieve the pain for approximately 4 hours and then the headache returns. Patient has no medical history and takes no medications regularly. However she did have her brother when he was 50 died of a brain aneurysm.  We'll send CBC, i-STAT and coags and discussed with neurosurgery  7:43 PM Discussed with Dr. Gerlene Fee who looked at films and wants a CT.  CT showed sdh with unknown cause.  Kritzer looked at films and feels there is no neurosurgical emergency.  Pt needs to f/u outpt with neurology.  Will have pt stop goody's powder.  Pt and family given strict return precautions.  Gwyneth Sprout, MD 06/27/12 1945

## 2012-06-27 NOTE — ED Notes (Signed)
Patient returned to room from CT. 

## 2012-06-27 NOTE — ED Notes (Signed)
Pt did not want her Vicodin prescription.  Pt states this medicine makes her very tired. Pt given Percocet prescription instead.

## 2012-06-27 NOTE — ED Notes (Signed)
Neuro Phy at bedside.

## 2012-06-27 NOTE — ED Notes (Signed)
Pt sent here from MRI for follow up after blood being found on her scan, pt states that  She had been having h/a for about a week

## 2012-06-27 NOTE — ED Notes (Signed)
Per Patient she started having a headache x 1 week ago and was watching tv when headache started. Patient sent to ED after having MRI. Patient states she was told she had a bleed in her brain.

## 2012-06-27 NOTE — ED Notes (Signed)
Patient being transported to CT at this time 

## 2012-06-30 ENCOUNTER — Ambulatory Visit (INDEPENDENT_AMBULATORY_CARE_PROVIDER_SITE_OTHER): Payer: 59 | Admitting: Neurology

## 2012-06-30 ENCOUNTER — Encounter: Payer: Self-pay | Admitting: Neurology

## 2012-06-30 VITALS — BP 124/78 | HR 81 | Ht 67.0 in | Wt 183.0 lb

## 2012-06-30 DIAGNOSIS — R519 Headache, unspecified: Secondary | ICD-10-CM | POA: Insufficient documentation

## 2012-06-30 DIAGNOSIS — R51 Headache: Secondary | ICD-10-CM

## 2012-06-30 MED ORDER — SUMATRIPTAN SUCCINATE 25 MG PO TABS: 25.0000 mg | ORAL_TABLET | ORAL | Status: DC | PRN

## 2012-06-30 MED ORDER — TOPIRAMATE 50 MG PO TABS: 50.0000 mg | ORAL_TABLET | Freq: Two times a day (BID) | ORAL | Status: DC

## 2012-06-30 NOTE — Progress Notes (Signed)
[facility]  PATIENT: Jenna Chapman DOB: 1970-08-29  HISTORICAL Jenna Chapman is a 42 years old right-handed Caucasian female, referred by her primary care physician Dr. Aleatha Borer, in the emergency room for evaluation of headaches  She only has occasionally had in the past, about a week ago, without triggering event, she began to experience severe daily headaches, pounding, with associated light, noise sensitivity, nauseous, it has been constant since its onset, sometimes more severe than the others. She was referred by primary care to have MRI scan at [facility] June [facility], there was no significant abnormality on the MRI, but she continued to have headaches, presented to the emergency room, repeat CT head showed possible right frontal parietal subdural hematoma, 3-4 mm, I do not have MRI film or formal report  She denies visual change, there was no lateralized motor or sensory deficit she was given prescription of prednisone tapering, which has been helpful, but she continued to have 5 out of 10 pounding headaches, she tried Percocet, oxycodone without much help.  REVIEW OF SYSTEMS: Full 14 system review of systems performed and notable only for ringing in ears, eye pain, coughing, headache, dizziness, change in appetite.  ALLERGIES: No Known Allergies  HOME MEDICATIONS: Outpatient Prescriptions Prior to Visit  Medication Sig Dispense Refill  . oxyCODONE-acetaminophen (PERCOCET/ROXICET) 5-325 MG per tablet Take 1 tablet by mouth every 6 (six) hours as needed for pain.  15 tablet  0  . predniSONE (DELTASONE) 10 MG tablet Take 10-60 mg by mouth daily. Take 60mg  by mouth on day 1, then decrease by 1 tablet daily until finished.      Marland Kitchen HYDROcodone-acetaminophen (NORCO/VICODIN) 5-325 MG per tablet Take 1 tablet by mouth every 6 (six) hours as needed for pain.  15 tablet  0   No facility-administered medications prior to visit.    PAST MEDICAL HISTORY: Past Medical  History  Diagnosis Date  . HA (headache)     PAST SURGICAL HISTORY: Past Surgical History  Procedure Laterality Date  . Abdominal hysterectomy      FAMILY HISTORY: Family History  Problem Relation Age of Onset  . High Cholesterol Mother   . High blood pressure Mother     SOCIAL HISTORY:  History   Social History  . Marital Status: Married    Spouse Name: Trey Paula    Number of Children: 0  . Years of Education: 12   Occupational History  . KEY HOLDER    Social History Main Topics  . Smoking status: Current Every Day Smoker    Types: Cigarettes  . Smokeless tobacco: Never Used  . Alcohol Use: No  . Drug Use: No  . Sexually Active: Not on file   Other Topics Concern  . Not on file   Social History Narrative   Patient lives at home with her husband Trey Paula). Patient works [employer]. Patient has a 12th grade education.   Caffeine- two cups of caffeine.   Right handed.     PHYSICAL EXAM    Filed Vitals:   06/30/12 1547  BP: 124/78  Pulse: 81  Height: 5\' 7"  (1.702 m)  Weight: 183 lb (83.008 kg)    Not recorded    Body mass index is 28.66 kg/(m^2).   Generalized: In no acute distress  Neck: Supple, no carotid bruits   Cardiac: Regular rate rhythm  Pulmonary: Clear to auscultation bilaterally  Musculoskeletal: No deformity  Neurological examination  Mentation: Alert oriented to time, place, history  taking, and causual conversation  Cranial nerve II-XII: Pupils were equal round reactive to light extraocular movements were full, visual field were full on confrontational test. facial sensation and strength were normal. hearing was intact to finger rubbing bilaterally. Uvula tongue midline.  head turning and shoulder shrug and were normal and symmetric.Tongue protrusion into cheek strength was normal.  Motor: normal tone, bulk and strength.  Sensory: Intact to fine touch, pinprick, preserved vibratory sensation, and proprioception at  toes.  Coordination: Normal finger to nose, heel-to-shin bilaterally there was no truncal ataxia  Gait: Rising up from seated position without assistance, normal stance, without trunk ataxia, moderate stride, good arm swing, smooth turning, able to perform tiptoe, and heel walking without difficulty.   Romberg signs: Negative  Deep tendon reflexes: Brachioradialis 2/2, biceps 2/2, triceps 2/2, patellar 2/2, Achilles 2/2, plantar responses were flexor bilaterally.   DIAGNOSTIC DATA (LABS, IMAGING, TESTING) - I reviewed patient records, labs, notes, testing and imaging myself where available.  Lab Results  Component Value Date   WBC 15.4* 06/27/2012   HGB 13.3 06/27/2012   HCT 39.0 06/27/2012   MCV 92.1 06/27/2012   PLT 177 06/27/2012      Component Value Date/Time   NA 142 06/27/2012 1656   K 4.5 06/27/2012 1656   CL 104 06/27/2012 1656   GLUCOSE 184* 06/27/2012 1656   BUN 21 06/27/2012 1656   CREATININE 1.00 06/27/2012 1656   No results found for this basename: CHOL, HDL, LDLCALC, LDLDIRECT, TRIG, CHOLHDL   No results found for this basename: HGBA1C   No results found for this basename: VITAMINB12   No results found for this basename: TSH   ASSESSMENT AND PLAN   41 years old right-handed Caucasian female, with no past medical history of headaches, now presenting with frequent headaches, severe pounding headache with associated nausea, light and noise sensitivity, normal neurological examination, CAT scan of the brain showed questionable right subdural small hemorrhage, MRI of the brain was reported normal, but I do not have the films to review.  1. Her headache has a lot of migraine features, she could not tolerate opiates, 2 I will try low dose of Imitrex, as needed, prednisone tapering. 3. return to clinic in 2-3 weeks, bring MRI cd to review.emergency room for worsening headaches  4 Topamax 50 mg twice a day as headache prevention .     Levert Feinstein, M.D. Ph.D.  [facility] [address], [address] [place], [place] [address] [phone]

## 2012-07-16 ENCOUNTER — Ambulatory Visit: Payer: Self-pay | Admitting: Neurology

## 2012-07-18 ENCOUNTER — Ambulatory Visit (INDEPENDENT_AMBULATORY_CARE_PROVIDER_SITE_OTHER): Payer: 59 | Admitting: Neurology

## 2012-07-18 ENCOUNTER — Encounter: Payer: Self-pay | Admitting: Neurology

## 2012-07-18 VITALS — BP 121/74 | HR 89 | Ht 67.0 in | Wt 182.0 lb

## 2012-07-18 DIAGNOSIS — S065X9A Traumatic subdural hemorrhage with loss of consciousness of unspecified duration, initial encounter: Secondary | ICD-10-CM

## 2012-07-18 DIAGNOSIS — I6200 Nontraumatic subdural hemorrhage, unspecified: Secondary | ICD-10-CM
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
## 2012-07-19 LAB — COMPREHENSIVE METABOLIC PANEL
Albumin/Globulin Ratio: 1.7 (ref 1.1–2.5)
Albumin: 4.3 g/dL (ref 3.5–5.5)
Alkaline Phosphatase: 88 IU/L (ref 39–117)
BUN/Creatinine Ratio: 14 (ref 9–23)
BUN: 13 mg/dL (ref 6–24)
GFR calc Af Amer: 87 mL/min/{1.73_m2} (ref >59)
GFR calc non Af Amer: 76 mL/min/{1.73_m2} (ref >59)
Glucose: 106 mg/dL — ABNORMAL HIGH (ref 65–99)
Potassium: 3.9 mmol/L (ref 3.5–5.2)
Total Bilirubin: 0.3 mg/dL (ref 0.0–1.2)
Total Protein: 6.8 g/dL (ref 6.0–8.5)

## 2012-07-22 LAB — RPR: RPR: NONREACTIVE

## 2012-07-22 LAB — COMPREHENSIVE METABOLIC PANEL
ALT: 12 IU/L (ref 0–32)
AST: 16 IU/L (ref 0–40)
Albumin/Globulin Ratio: 2.1 (ref 1.1–2.5)
Calcium: 9.7 mg/dL (ref 8.7–10.2)
Chloride: 104 mmol/L (ref 97–108)
GFR calc non Af Amer: 76 mL/min/{1.73_m2} (ref >59)
Glucose: 103 mg/dL — ABNORMAL HIGH (ref 65–99)
Potassium: 3.8 mmol/L (ref 3.5–5.2)
Sodium: 146 mmol/L — ABNORMAL HIGH (ref 134–144)
Total Protein: 6.6 g/dL (ref 6.0–8.5)

## 2012-07-22 LAB — HIV ANTIBODY (ROUTINE TESTING W REFLEX): HIV 1/O/2 Abs-Index Value: <1 (ref <1.00)

## 2012-07-22 LAB — CBC
HCT: 38.9 % (ref 34.0–46.6)
MCHC: 35.2 g/dL (ref 31.5–35.7)
MCV: 89 fL (ref 79–97)
Platelets: 149 10*3/uL — ABNORMAL LOW (ref 150–379)
RDW: 13 % (ref 12.3–15.4)

## 2012-07-22 LAB — ANA: Anti Nuclear Antibody(ANA): NEGATIVE

## 2012-07-22 LAB — FOLATE: Folate: 11.3 ng/mL (ref >3.0)

## 2012-07-22 LAB — VITAMIN B12: Vitamin B-12: 1196 pg/mL — ABNORMAL HIGH (ref 211–946)

## 2012-07-22 NOTE — Progress Notes (Signed)
Quick Note:  Called pt and relayed normal labs. Pt verbalized understanding. ______

## 2012-07-31 ENCOUNTER — Ambulatory Visit: Payer: 59 | Admitting: Neurology

## 2012-08-07 ENCOUNTER — Other Ambulatory Visit: Payer: 59

## 2012-10-02 ENCOUNTER — Ambulatory Visit (INDEPENDENT_AMBULATORY_CARE_PROVIDER_SITE_OTHER): Payer: 59

## 2012-10-02 ENCOUNTER — Encounter: Payer: Self-pay | Admitting: Neurology

## 2012-10-02 DIAGNOSIS — I6200 Nontraumatic subdural hemorrhage, unspecified: Secondary | ICD-10-CM

## 2012-10-02 DIAGNOSIS — S065X9A Traumatic subdural hemorrhage with loss of consciousness of unspecified duration, initial encounter: Secondary | ICD-10-CM

## 2012-10-02 MED ORDER — GADOPENTETATE DIMEGLUMINE 469.01 MG/ML IV SOLN: 17.0000 mL | Freq: Once | INTRAVENOUS | Status: AC | PRN

## 2012-10-03 MED ORDER — GADOPENTETATE DIMEGLUMINE 469.01 MG/ML IV SOLN: 17.0000 mL | Freq: Once | INTRAVENOUS | Status: AC | PRN

## 2012-10-09 NOTE — Progress Notes (Signed)
Quick Note:  I called and relayed the MRI brain results as normal study. She verbalized understanding. ______

## 2012-11-07 ENCOUNTER — Encounter: Payer: Self-pay | Admitting: Neurology

## 2012-11-07 ENCOUNTER — Ambulatory Visit (INDEPENDENT_AMBULATORY_CARE_PROVIDER_SITE_OTHER): Payer: 59 | Admitting: Neurology

## 2012-11-07 VITALS — BP 113/80 | HR 87 | Ht 66.0 in | Wt 151.0 lb

## 2012-11-07 DIAGNOSIS — R9389 Abnormal findings on diagnostic imaging of other specified body structures: Secondary | ICD-10-CM
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
## 2013-01-09 ENCOUNTER — Telehealth: Payer: Self-pay | Admitting: Neurology

## 2013-01-09 NOTE — Telephone Encounter (Signed)
Results given.

## 2013-01-09 NOTE — Telephone Encounter (Signed)
Last 3-4 weeks, tips of fingers on right hand going numb constantly. 1st-4th fingers. Requesting follow up appointment for evaluation. Scheduled with Charlott Holler, NP for Monday at 11:00 a.m.

## 2013-01-09 NOTE — Telephone Encounter (Signed)
This is a new problem, not what Dr. Krista Blue was seeing her for (headaches), thus it is not something I should see her for in follow up.  She should see Dr. Krista Blue.

## 2013-01-09 NOTE — Telephone Encounter (Signed)
Pt calling for an appt states that her fingers on her right hand are going numb and she doesn't know why. Please call

## 2013-01-12 ENCOUNTER — Telehealth: Payer: Self-pay | Admitting: *Deleted

## 2013-01-12 ENCOUNTER — Ambulatory Visit: Payer: Self-pay | Admitting: Nurse Practitioner

## 2013-01-12 DIAGNOSIS — G35 Multiple sclerosis: Secondary | ICD-10-CM

## 2013-01-12 NOTE — Telephone Encounter (Signed)
I have called Jenna Chapman, she has right hand numbness for 3-4 weeks, she denies weakness, she also has numbness in her right lip,  Reviewed previous chart, we will considering the possibility of multiple sclerosis,  I will proceed with fluoroscopy guided lumbar puncture, visually potential keep followup appointment in February 17th,

## 2013-01-23 ENCOUNTER — Ambulatory Visit
Admission: RE | Admit: 2013-01-23 | Discharge: 2013-01-23 | Disposition: A | Discharge status: Home / Self Care | Payer: 59 | Source: Ambulatory Visit | Attending: Neurology | Admitting: Neurology

## 2013-01-23 VITALS — BP 106/75 | HR 50

## 2013-01-23 DIAGNOSIS — R9389 Abnormal findings on diagnostic imaging of other specified body structures: Secondary | ICD-10-CM

## 2013-01-23 DIAGNOSIS — R51 Headache: Secondary | ICD-10-CM

## 2013-01-23 DIAGNOSIS — R519 Headache, unspecified: Secondary | ICD-10-CM

## 2013-01-23 DIAGNOSIS — G35 Multiple sclerosis: Secondary | ICD-10-CM

## 2013-01-23 NOTE — Discharge Instructions (Signed)

## 2013-01-23 NOTE — Progress Notes (Signed)
One tiger-topped tube blood obtained without difficulty for LP labs from right [facility] space; site unremarkable.  jkl

## 2013-01-24 LAB — GRAM STAIN
Gram Stain: NONE SEEN
Gram Stain: NONE SEEN

## 2013-01-26 LAB — CSF PANEL 1
GLUCOSE CSF: 54 mg/dL (ref 43–76)
RBC Count, CSF: 7 cu mm — ABNORMAL HIGH
TUBE #: 4
Total Protein, CSF: 43 mg/dL (ref 15–45)
WBC, CSF: 2 cu mm (ref 0–5)

## 2013-01-28 LAB — MULTIPLE SCLEROSIS PANEL 2
ALBUMIN CSF MSPROF: 16 mg/dL (ref <35)
Albumin Index: 3.7 (ref <9.0)
Albumin: 4310 mg/dL (ref 3700–5410)
CNS-IGG SYNTHESIS RATE: 0.7 mg/(24.h) (ref <3.3)
IGA TOTAL: 132 mg/dL (ref 81–463)
IGG MSPROF: 0.09 mg/dL (ref <0.10)
IGG TOTAL: 808 mg/dL (ref 694–1618)
IGM-CSF: 0.1 mg/dL — AB (ref <0.10)
IgA CSF: 0.19 mg/dL (ref 0.15–0.60)
IgA MSPROF: <0.001 mg/dL (ref <0.010)
IgG Total CSF: 2.1 mg/dL (ref 0.5–6.1)
IgG-Index: 0.7 — ABNORMAL HIGH (ref <0.70)
IgM Total: 242 mg/dL (ref 48–271)
Myelin basic protein, csf: <2 mcg/L (ref <4.1)

## 2013-02-17 ENCOUNTER — Telehealth: Payer: Self-pay | Admitting: Neurology

## 2013-02-17 ENCOUNTER — Ambulatory Visit: Payer: Self-pay | Admitting: Neurology

## 2013-02-17 NOTE — Telephone Encounter (Signed)
Called patient to see if she was able to come in earlier per [facility]. Patient declined she will call on Friday to reschedule.

## 2013-02-20 LAB — FUNGUS CULTURE W SMEAR: SMEAR RESULT: NONE SEEN

## 2013-08-20 ENCOUNTER — Other Ambulatory Visit: Payer: Self-pay

## 2013-08-24 LAB — CYTOLOGY - PAP

## 2013-11-09 ENCOUNTER — Ambulatory Visit: Payer: 59 | Admitting: Neurology

## 2013-11-19 ENCOUNTER — Encounter: Payer: Self-pay | Admitting: Neurology

## 2013-11-19 ENCOUNTER — Ambulatory Visit (INDEPENDENT_AMBULATORY_CARE_PROVIDER_SITE_OTHER): Payer: 59 | Admitting: Neurology

## 2013-11-19 VITALS — BP 112/67 | HR 76 | Ht 65.0 in | Wt 169.0 lb

## 2013-11-19 DIAGNOSIS — I6200 Nontraumatic subdural hemorrhage, unspecified: Secondary | ICD-10-CM

## 2013-11-19 DIAGNOSIS — R9389 Abnormal findings on diagnostic imaging of other specified body structures: Secondary | ICD-10-CM

## 2013-11-19 DIAGNOSIS — M542 Cervicalgia: Secondary | ICD-10-CM

## 2013-11-19 DIAGNOSIS — S065XAA Traumatic subdural hemorrhage with loss of consciousness status unknown, initial encounter: Secondary | ICD-10-CM

## 2013-11-19 DIAGNOSIS — S065X9A Traumatic subdural hemorrhage with loss of consciousness of unspecified duration, initial encounter: Secondary | ICD-10-CM

## 2013-11-19 DIAGNOSIS — R938 Abnormal findings on diagnostic imaging of other specified body structures: Secondary | ICD-10-CM
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
## 2014-01-07 ENCOUNTER — Ambulatory Visit: Payer: 59 | Admitting: Neurology

## 2014-02-09 ENCOUNTER — Ambulatory Visit (INDEPENDENT_AMBULATORY_CARE_PROVIDER_SITE_OTHER): Payer: 59 | Admitting: Neurology

## 2014-02-09 ENCOUNTER — Encounter: Payer: Self-pay | Admitting: Neurology

## 2014-02-09 VITALS — BP 121/69 | HR 69 | Ht 65.0 in | Wt 172.0 lb

## 2014-02-09 DIAGNOSIS — R9389 Abnormal findings on diagnostic imaging of other specified body structures: Secondary | ICD-10-CM

## 2014-02-09 DIAGNOSIS — I6200 Nontraumatic subdural hemorrhage, unspecified: Secondary | ICD-10-CM

## 2014-02-09 DIAGNOSIS — S065XAA Traumatic subdural hemorrhage with loss of consciousness status unknown, initial encounter: Secondary | ICD-10-CM

## 2014-02-09 DIAGNOSIS — G43009 Migraine without aura, not intractable, without status migrainosus: Secondary | ICD-10-CM

## 2014-02-09 DIAGNOSIS — R938 Abnormal findings on diagnostic imaging of other specified body structures: Secondary | ICD-10-CM

## 2014-02-09 DIAGNOSIS — S065X9A Traumatic subdural hemorrhage with loss of consciousness of unspecified duration, initial encounter: Secondary | ICD-10-CM

## 2014-02-09 MED ORDER — NORTRIPTYLINE HCL 10 MG PO CAPS: ORAL_CAPSULE | ORAL | Status: DC

## 2014-02-09 MED ORDER — RIZATRIPTAN BENZOATE 5 MG PO TBDP
5.0000 mg | ORAL_TABLET | ORAL | Status: DC | PRN
Start: 2014-02-09 — End: 2014-03-16
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
## 2014-02-09 NOTE — Addendum Note (Signed)
Addended by: Marcial Pacas on: 02/09/2014 10:40 AM   Modules accepted: Orders

## 2014-02-09 NOTE — Addendum Note (Signed)
Addended by: Marcial Pacas on: 02/09/2014 10:41 AM   Modules accepted: Orders

## 2014-02-25 ENCOUNTER — Ambulatory Visit (INDEPENDENT_AMBULATORY_CARE_PROVIDER_SITE_OTHER): Payer: 59

## 2014-02-25 DIAGNOSIS — G43009 Migraine without aura, not intractable, without status migrainosus: Secondary | ICD-10-CM

## 2014-02-25 DIAGNOSIS — R938 Abnormal findings on diagnostic imaging of other specified body structures: Secondary | ICD-10-CM | POA: Diagnosis not present

## 2014-02-25 DIAGNOSIS — S065XAA Traumatic subdural hemorrhage with loss of consciousness status unknown, initial encounter: Secondary | ICD-10-CM

## 2014-02-25 DIAGNOSIS — S065X9A Traumatic subdural hemorrhage with loss of consciousness of unspecified duration, initial encounter: Secondary | ICD-10-CM

## 2014-02-25 DIAGNOSIS — I6200 Nontraumatic subdural hemorrhage, unspecified: Secondary | ICD-10-CM | POA: Diagnosis not present

## 2014-02-25 DIAGNOSIS — R9389 Abnormal findings on diagnostic imaging of other specified body structures: Secondary | ICD-10-CM

## 2014-03-01 ENCOUNTER — Telehealth: Payer: Self-pay | Admitting: Neurology

## 2014-03-01 MED ORDER — GADOPENTETATE DIMEGLUMINE 469.01 MG/ML IV SOLN: 17.0000 mL | Freq: Once | INTRAVENOUS | Status: AC | PRN

## 2014-03-01 NOTE — Telephone Encounter (Signed)
Patient aware of results - she will keep her 03/16/14 appt to further discuss.

## 2014-03-01 NOTE — Telephone Encounter (Signed)
Jenna Chapman, please call patients, continued evidence of mild abnormal MRI of the brain, I will go over it in detail at her follow-up in March 15  Abnormal MRI scan of the brain showing scattered multiple tiny supratentorial subcortical and periventricular nonspecific white matter hyperintensities with differential discussed above. No enhancing lesions are noted.

## 2014-03-16 ENCOUNTER — Ambulatory Visit (INDEPENDENT_AMBULATORY_CARE_PROVIDER_SITE_OTHER): Payer: 59 | Admitting: Neurology

## 2014-03-16 ENCOUNTER — Encounter: Payer: Self-pay | Admitting: Neurology

## 2014-03-16 VITALS — BP 122/75 | HR 68 | Ht 65.0 in | Wt 164.0 lb

## 2014-03-16 DIAGNOSIS — G43009 Migraine without aura, not intractable, without status migrainosus: Secondary | ICD-10-CM | POA: Diagnosis not present

## 2014-03-16 DIAGNOSIS — G4710 Hypersomnia, unspecified: Secondary | ICD-10-CM

## 2014-03-16 DIAGNOSIS — R930 Abnormal findings on diagnostic imaging of skull and head, not elsewhere classified: Secondary | ICD-10-CM | POA: Diagnosis not present

## 2014-03-16 DIAGNOSIS — S065XAA Traumatic subdural hemorrhage with loss of consciousness status unknown, initial encounter: Secondary | ICD-10-CM

## 2014-03-16 DIAGNOSIS — S065X9A Traumatic subdural hemorrhage with loss of consciousness of unspecified duration, initial encounter: Secondary | ICD-10-CM

## 2014-03-16 DIAGNOSIS — I6200 Nontraumatic subdural hemorrhage, unspecified: Secondary | ICD-10-CM | POA: Diagnosis not present

## 2014-03-16 MED ORDER — SUMATRIPTAN SUCCINATE 100 MG PO TABS: 100.0000 mg | ORAL_TABLET | ORAL | Status: DC | PRN
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
## 2014-03-17 ENCOUNTER — Telehealth: Payer: Self-pay | Admitting: Neurology

## 2014-03-17 DIAGNOSIS — G4719 Other hypersomnia: Secondary | ICD-10-CM

## 2014-03-17 DIAGNOSIS — E663 Overweight: Secondary | ICD-10-CM

## 2014-03-17 DIAGNOSIS — R51 Headache: Secondary | ICD-10-CM

## 2014-03-17 DIAGNOSIS — R519 Headache, unspecified: Secondary | ICD-10-CM

## 2014-03-17 NOTE — Telephone Encounter (Signed)
In House Sleep study request review: This patient has an underlying medical history of abnormal brain MRI, recurrent migrainous headaches, overweight state and is referred by Dr. Krista Blue for an attended sleep study due to a report of excessive daytime somnolence, and recurrent headaches. I will order a split-night sleep study and see the patient in sleep medicine consultation afterwards as appropriate. Please print this note and attach to sleep study chart/package.   Sleep Acquisition Technologist instructions: Please score at 4% and split if 2 hour estimated AHI >20/h, unless mandated otherwise by the insurance carrier.    Star Age, MD, PhD [facility])

## 2014-03-17 NOTE — Telephone Encounter (Signed)
Dr.Yijun Krista Blue refers patient for attended sleep study.  Height: 5'5"  Weight: 164 lb  BMI: 27.29  Past Medical History: HA (headache)  Sleep Symptoms: Complains tired all the time, excessive daytime sleepiness, fatigue   Epworth Score: ESS (19)  Medication:  Naproxen Sodium   Take by mouth as needed.       Nortriptyline HCl (Cap) PAMELOR 10 MG One po qhs xone week, then 2 tabs po qhs      SUMAtriptan Succinate (Tab) IMITREX 100 MG Take 1 tablet (100 mg total) by mouth every 2 (two) hours as needed for migraine. May repeat in 2 hours if headache persists or recurs.      Ins: [employer] & Plan: 44 years old right-handed Caucasian female, with no past medical history of headaches, now presenting with frequent headaches, severe pounding headache with associated nausea, light and noise sensitivity, normal neurological examination, CAT scan of the brain showed questionable right subdural small hemorrhage, MRI of the brain thin layer right subcortical subdural hematoma, related to her head trauma, in addition there was moderate oval to round-shaped subcortical white matter disease, raised the possibility of small vessel disease vs. multiple sclerosis on repeat MRI, there was no significant change in her supratentorium lesions, CSF testing showed 0 oligoclonal banding,  1. multiple sclerosis remains a possibility, other possibility also including small vessel disease, inflammatory etiology  2. Will repeat MRI of the brain w/wo 3. Nortriptyline 10 mg, titrating to 20 mg every night as preventative medications 4. imitrex as needed 5, return to clinic in 3 months with Hoyle Sauer 6. ESS 19, FSS 43, will refer her to sleep study  Please review patient information and submit instructions for scheduling and orders for sleep technologist. Thank you.

## 2014-03-24 ENCOUNTER — Telehealth: Payer: Self-pay | Admitting: Neurology

## 2014-03-24 DIAGNOSIS — R51 Headache: Secondary | ICD-10-CM

## 2014-03-24 DIAGNOSIS — R519 Headache, unspecified: Secondary | ICD-10-CM

## 2014-03-24 DIAGNOSIS — E663 Overweight: Secondary | ICD-10-CM

## 2014-03-24 DIAGNOSIS — G4719 Other hypersomnia: Secondary | ICD-10-CM

## 2014-03-24 NOTE — Telephone Encounter (Signed)
Patient insurance denied the in lab sleep study if you would like for her to do the HST please put order in and I will schedule appt w/David.

## 2014-03-24 NOTE — Telephone Encounter (Signed)
This patient's insurance denied an attended sleep study. I will order home sleep test.  

## 2014-04-01 NOTE — Telephone Encounter (Signed)
HST scheduled for 04/12/14.

## 2014-04-12 ENCOUNTER — Ambulatory Visit (INDEPENDENT_AMBULATORY_CARE_PROVIDER_SITE_OTHER): Payer: 59 | Admitting: Neurology

## 2014-04-12 DIAGNOSIS — R51 Headache: Secondary | ICD-10-CM

## 2014-04-12 DIAGNOSIS — R519 Headache, unspecified: Secondary | ICD-10-CM

## 2014-04-12 DIAGNOSIS — E663 Overweight: Secondary | ICD-10-CM

## 2014-04-12 DIAGNOSIS — G4710 Hypersomnia, unspecified: Secondary | ICD-10-CM

## 2014-04-12 DIAGNOSIS — R0683 Snoring: Secondary | ICD-10-CM

## 2014-04-12 DIAGNOSIS — G4719 Other hypersomnia: Secondary | ICD-10-CM

## 2014-04-12 NOTE — Sleep Study (Signed)
Please see the scanned sleep study interpretation located in the Procedure tab within the Chart Review section. 

## 2014-04-21 ENCOUNTER — Telehealth: Payer: Self-pay | Admitting: Neurology

## 2014-04-21 NOTE — Telephone Encounter (Signed)
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   Beverlee Nims: Please call and notify the patient that the recent home sleep test did not show any significant obstructive sleep apnea. Patient can follow up with the referring provider. She actually has an appt with Cecille Rubin, NP on 06/16/14 at 10:30 AM.  A copy of the report will be sent to the PCP and referring MD, if other than PCP.  Once you have spoken to patient, you can close this encounter.   Thanks,  Star Age, MD, PhD [facility])  Shanon Brow: pls route report to Drs. Cathie Hoops, thx

## 2014-04-22 ENCOUNTER — Encounter: Payer: Self-pay | Admitting: Neurology

## 2014-04-22 NOTE — Telephone Encounter (Signed)
Pt aware of results and aware of next f/u appt with Hoyle Sauer, NP.

## 2014-06-16 ENCOUNTER — Ambulatory Visit (INDEPENDENT_AMBULATORY_CARE_PROVIDER_SITE_OTHER): Payer: 59 | Admitting: Nurse Practitioner

## 2014-06-16 ENCOUNTER — Encounter: Payer: Self-pay | Admitting: Nurse Practitioner

## 2014-06-16 VITALS — BP 122/82 | HR 81 | Ht 67.0 in | Wt 163.4 lb

## 2014-06-16 DIAGNOSIS — R930 Abnormal findings on diagnostic imaging of skull and head, not elsewhere classified: Secondary | ICD-10-CM | POA: Diagnosis not present

## 2014-06-16 DIAGNOSIS — R51 Headache: Secondary | ICD-10-CM

## 2014-06-16 DIAGNOSIS — S065X9A Traumatic subdural hemorrhage with loss of consciousness of unspecified duration, initial encounter: Secondary | ICD-10-CM

## 2014-06-16 DIAGNOSIS — I6200 Nontraumatic subdural hemorrhage, unspecified: Secondary | ICD-10-CM | POA: Diagnosis not present

## 2014-06-16 DIAGNOSIS — G43009 Migraine without aura, not intractable, without status migrainosus: Secondary | ICD-10-CM

## 2014-06-16 DIAGNOSIS — S065XAA Traumatic subdural hemorrhage with loss of consciousness status unknown, initial encounter: Secondary | ICD-10-CM

## 2014-06-16 DIAGNOSIS — C539 Malignant neoplasm of cervix uteri, unspecified: Secondary | ICD-10-CM | POA: Insufficient documentation

## 2014-06-16 DIAGNOSIS — R519 Headache, unspecified: Secondary | ICD-10-CM

## 2014-06-16 DIAGNOSIS — J302 Other seasonal allergic rhinitis: Secondary | ICD-10-CM | POA: Insufficient documentation

## 2014-06-16 MED ORDER — PREDNISONE 10 MG PO TABS: 10.0000 mg | ORAL_TABLET | Freq: Every day | ORAL | Status: DC

## 2014-06-16 MED ORDER — NORTRIPTYLINE HCL 10 MG PO CAPS: 30.0000 mg | ORAL_CAPSULE | Freq: Every day | ORAL | Status: DC

## 2014-06-16 MED ORDER — ELETRIPTAN HYDROBROMIDE 40 MG PO TABS: 40.0000 mg | ORAL_TABLET | ORAL | Status: DC | PRN
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
## 2014-06-16 NOTE — Patient Instructions (Signed)
Try Relpax acutely co pay card  Given Prednisone 6 date dose pack take as directed Relpax 40 mg acute medication may repeat 1 Follow-up in 4-6 months Repeat MRI of the brain in February 2017

## 2014-06-17 NOTE — Progress Notes (Signed)
I have reviewed and agreed above plan. 

## 2014-06-23 NOTE — Telephone Encounter (Signed)
Opened in error

## 2014-06-24 ENCOUNTER — Ambulatory Visit (INDEPENDENT_AMBULATORY_CARE_PROVIDER_SITE_OTHER): Payer: 59 | Admitting: Family Medicine

## 2014-06-24 VITALS — BP 118/66 | HR 95 | Temp 99.0°F | Resp 17 | Ht 67.0 in | Wt 164.4 lb

## 2014-06-24 DIAGNOSIS — K5900 Constipation, unspecified: Secondary | ICD-10-CM

## 2014-06-24 DIAGNOSIS — N10 Acute tubulo-interstitial nephritis: Secondary | ICD-10-CM | POA: Diagnosis not present

## 2014-06-24 DIAGNOSIS — R1032 Left lower quadrant pain: Secondary | ICD-10-CM | POA: Diagnosis not present

## 2014-06-24 LAB — POCT CBC
Granulocyte percent: 69.7 %G (ref 37–80)
HCT, POC: 42.3 % (ref 37.7–47.9)
Hemoglobin: 13.9 g/dL (ref 12.2–16.2)
Lymph, poc: 3.8 — AB (ref 0.6–3.4)
MCH, POC: 30.1 pg (ref 27–31.2)
MCHC: 32.9 g/dL (ref 31.8–35.4)
MCV: 91.5 fL (ref 80–97)
MID (cbc): 0.8 (ref 0–0.9)
MPV: 8.4 fL (ref 0–99.8)
POC Granulocyte: 10.5 — AB (ref 2–6.9)
POC LYMPH PERCENT: 25.3 %L (ref 10–50)
POC MID %: 5 %M (ref 0–12)
Platelet Count, POC: 195 10*3/uL (ref 142–424)
RBC: 4.62 M/uL (ref 4.04–5.48)
RDW, POC: 14.3 %
WBC: 15 10*3/uL — AB (ref 4.6–10.2)

## 2014-06-24 LAB — POCT URINALYSIS DIPSTICK
BILIRUBIN UA: NEGATIVE
Glucose, UA: NEGATIVE
Ketones, UA: NEGATIVE
NITRITE UA: POSITIVE
Protein, UA: NEGATIVE
Spec Grav, UA: 1.015
Urobilinogen, UA: 0.2
pH, UA: 8

## 2014-06-24 LAB — POCT UA - MICROSCOPIC ONLY
Casts, Ur, LPF, POC: NEGATIVE
Crystals, Ur, HPF, POC: NEGATIVE
Yeast, UA: NEGATIVE

## 2014-06-24 MED ORDER — POLYETHYLENE GLYCOL 3350 17 GM/SCOOP PO POWD: 17.0000 g | Freq: Two times a day (BID) | ORAL | Status: DC | PRN

## 2014-06-24 MED ORDER — CIPROFLOXACIN HCL 500 MG PO TABS: 500.0000 mg | ORAL_TABLET | Freq: Two times a day (BID) | ORAL | Status: DC

## 2014-06-24 MED ORDER — DOCUSATE SODIUM 100 MG PO CAPS
100.0000 mg | ORAL_CAPSULE | Freq: Two times a day (BID) | ORAL | Status: DC
Start: 2014-06-24 — End: 2014-07-15

## 2014-06-24 MED ORDER — GI COCKTAIL ~~LOC~~
30.0000 mL | Freq: Once | ORAL | Status: AC
Start: 2014-06-24 — End: 2014-06-24
  Administered 2014-06-24: 30 mL via ORAL

## 2014-06-24 NOTE — Patient Instructions (Signed)
Pyelonephritis, Adult Pyelonephritis is a kidney infection. A kidney infection can happen quickly, or it can last for a long time. HOME CARE   Take your medicine (antibiotics) as told. Finish it even if you start to feel better.  Keep all doctor visits as told.  Drink enough fluids to keep your pee (urine) clear or pale yellow.  Only take medicine as told by your doctor. GET HELP RIGHT AWAY IF:   You have a fever or lasting symptoms for more than 2-3 days.  You have a fever and your symptoms suddenly get worse.  You cannot take your medicine or drink fluids as told.  You have chills and shaking.  You feel very weak or pass out (faint).  You do not feel better after 2 days. MAKE SURE YOU:  Understand these instructions.  Will watch your condition.  Will get help right away if you are not doing well or get worse. Document Released: 01/26/2004 Document Revised: 06/19/2011 Document Reviewed: 06/07/2010 [facility] Patient Information 2015 [place], [place]. This information is not intended to replace advice given to you by your health care provider. Make sure you discuss any questions you have with your health care provider.  Constipation Constipation is when a person:  Poops (has a bowel movement) less than 3 times a week.  Has a hard time pooping.  Has poop that is dry, hard, or bigger than normal. HOME CARE   Eat foods with a lot of fiber in them. This includes fruits, vegetables, beans, and whole grains such as brown rice.  Avoid fatty foods and foods with a lot of sugar. This includes french fries, hamburgers, cookies, candy, and soda.  If you are not getting enough fiber from food, take products with added fiber in them (supplements).  Drink enough fluid to keep your pee (urine) clear or pale yellow.  Exercise on a regular basis, or as told by your doctor.  Go to the restroom when you feel like you need to poop. Do not hold it.  Only take medicine as told by your  doctor. Do not take medicines that help you poop (laxatives) without talking to your doctor first. GET HELP RIGHT AWAY IF:   You have bright red blood in your poop (stool).  Your constipation lasts more than 4 days or gets worse.  You have belly (abdominal) or butt (rectal) pain.  You have thin poop (as thin as a pencil).  You lose weight, and it cannot be explained. MAKE SURE YOU:   Understand these instructions.  Will watch your condition.  Will get help right away if you are not doing well or get worse. Document Released: 06/06/2007 Document Revised: 12/23/2012 Document Reviewed: 09/29/2012 [facility] Patient Information 2015 [place], [place]. This information is not intended to replace advice given to you by your health care provider. Make sure you discuss any questions you have with your health care provider.

## 2014-06-24 NOTE — Progress Notes (Signed)
Subjective:    Patient ID: Jenna Chapman, female    DOB: 11-04-1970, 44 y.o.   MRN: [id number]  HPI Patient presents for left sided abdominal pain that has been present for the past 3 days and progressively getting worse. Pain is 10/10 on pain scale and radiates to the back. Endorses nausea, decreased appetite, increased flatulence, and frequency. Denies vomiting, hematuria, belching, urgency, constipation, diarrhea, and vaginal discharge/bleeding. Sexually active with husband only. Has not been able to have a bowel movement since she took prednisone. D/C prednisone 4 days ago and is unsure why she was taking medication. No change in diet. Surgical history positive for hysterectomy, but no other abdominal surgeries.    Review of Systems As noted above.    Objective:   Physical Exam  Constitutional: She is oriented to person, place, and time. She appears well-developed and well-nourished. No distress.  Blood pressure 118/66, pulse 95, temperature 99 F (37.2 C), temperature source Oral, resp. rate 17, height 5\' 7"  (1.702 m), weight 164 lb 6.4 oz (74.571 kg), SpO2 97 %.  HENT:  Head: Normocephalic and atraumatic.  Right Ear: External ear normal.  Left Ear: External ear normal.  Eyes: Conjunctivae are normal. Right eye exhibits no discharge. Left eye exhibits no discharge. No scleral icterus.  Cardiovascular: Normal rate, regular rhythm and normal heart sounds.  Exam reveals no gallop and no friction rub.   No murmur heard. Pulmonary/Chest: Effort normal and breath sounds normal. No respiratory distress. She has no decreased breath sounds. She has no wheezes. She has no rhonchi. She has no rales.  Abdominal: Soft. Bowel sounds are normal. She exhibits no distension and no mass. There is tenderness in the epigastric area and left upper quadrant. There is no rigidity, no rebound, no guarding, no CVA tenderness, no tenderness at McBurney's point and negative Murphy's sign. No hernia.    Neurological: She is alert and oriented to person, place, and time.  Skin: Skin is warm and dry. No rash noted. She is not diaphoretic. No erythema. No pallor.  Psychiatric: She has a normal mood and affect. Her behavior is normal. Judgment and thought content normal.   Results for orders placed or performed in visit on 06/24/14  POCT CBC  Result Value Ref Range   WBC 15.0 (A) 4.6 - 10.2 K/uL   Lymph, poc 3.8 (A) 0.6 - 3.4   POC LYMPH PERCENT 25.3 10 - 50 %L   MID (cbc) 0.8 0 - 0.9   POC MID % 5.0 0 - 12 %M   POC Granulocyte 10.5 (A) 2 - 6.9   Granulocyte percent 69.7 37 - 80 %G   RBC 4.62 4.04 - 5.48 M/uL   Hemoglobin 13.9 12.2 - 16.2 g/dL   HCT, POC 42.3 37.7 - 47.9 %   MCV 91.5 80 - 97 fL   MCH, POC 30.1 27 - 31.2 pg   MCHC 32.9 31.8 - 35.4 g/dL   RDW, POC 14.3 %   Platelet Count, POC 195 142 - 424 K/uL   MPV 8.4 0 - 99.8 fL  POCT UA - Microscopic Only  Result Value Ref Range   WBC, Ur, HPF, POC 4-6    RBC, urine, microscopic 2-4    Bacteria, U Microscopic 3+    Mucus, UA trace    Epithelial cells, urine per micros 2-4    Crystals, Ur, HPF, POC neg    Casts, Ur, LPF, POC neg    Yeast, UA neg   POCT  urinalysis dipstick  Result Value Ref Range   Color, UA yellow    Clarity, UA cloudy    Glucose, UA neg    Bilirubin, UA neg    Ketones, UA neg    Spec Grav, UA 1.015    Blood, UA small    pH, UA 8.0    Protein, UA neg    Urobilinogen, UA 0.2    Nitrite, UA positive    Leukocytes, UA small (1+) (A) Negative       Assessment & Plan:  1. Acute pyelonephritis 2. Left lower quadrant pain GI cocktail did not improve sx. Should increase water intake to 64 oz daily.  - ciprofloxacin (CIPRO) 500 MG tablet; Take 1 tablet (500 mg total) by mouth 2 (two) times daily.  Dispense: 20 tablet; Refill: 0 - Urine culture - POCT CBC - POCT UA - Microscopic Only - POCT urinalysis dipstick - Comprehensive metabolic panel - gi cocktail (Maalox,Lidocaine,Donnatal); Take 30 mLs by  mouth once.  3. Constipation, unspecified constipation type Increase water and fiber intake. Should improve as prednisone has been discontinued. - docusate sodium (COLACE) 100 MG capsule; Take 1 capsule (100 mg total) by mouth 2 (two) times daily.  Dispense: 10 capsule; Refill: 0 - polyethylene glycol powder (GLYCOLAX/MIRALAX) powder; Take 17 g by mouth 2 (two) times daily as needed.  Dispense: 3350 g; Refill: 1   Gavinn Collard PA-C  [facility] 06/24/2014 7:57 PM

## 2014-06-25 LAB — COMPREHENSIVE METABOLIC PANEL
ALT: 11 U/L (ref 0–35)
AST: 11 U/L (ref 0–37)
Albumin: 4.2 g/dL (ref 3.5–5.2)
Alkaline Phosphatase: 71 U/L (ref 39–117)
BILIRUBIN TOTAL: 0.8 mg/dL (ref 0.2–1.2)
BUN: 15 mg/dL (ref 6–23)
CALCIUM: 9.6 mg/dL (ref 8.4–10.5)
CO2: 30 mEq/L (ref 19–32)
CREATININE: 0.84 mg/dL (ref 0.50–1.10)
Chloride: 101 mEq/L (ref 96–112)
Glucose, Bld: 90 mg/dL (ref 70–99)
Potassium: 4.3 mEq/L (ref 3.5–5.3)
Sodium: 139 mEq/L (ref 135–145)
Total Protein: 6.8 g/dL (ref 6.0–8.3)

## 2014-06-28 ENCOUNTER — Encounter: Payer: Self-pay | Admitting: Physician Assistant

## 2014-06-28 LAB — URINE CULTURE

## 2014-07-15 ENCOUNTER — Ambulatory Visit (INDEPENDENT_AMBULATORY_CARE_PROVIDER_SITE_OTHER): Payer: 59 | Admitting: Family Medicine

## 2014-07-15 ENCOUNTER — Ambulatory Visit (INDEPENDENT_AMBULATORY_CARE_PROVIDER_SITE_OTHER): Payer: 59

## 2014-07-15 VITALS — BP 118/76 | HR 72 | Temp 97.9°F | Resp 16 | Ht 67.0 in | Wt 161.0 lb

## 2014-07-15 DIAGNOSIS — J209 Acute bronchitis, unspecified: Secondary | ICD-10-CM | POA: Diagnosis not present

## 2014-07-15 DIAGNOSIS — N10 Acute tubulo-interstitial nephritis: Secondary | ICD-10-CM

## 2014-07-15 DIAGNOSIS — J0120 Acute ethmoidal sinusitis, unspecified: Secondary | ICD-10-CM

## 2014-07-15 DIAGNOSIS — Z720 Tobacco use: Secondary | ICD-10-CM

## 2014-07-15 DIAGNOSIS — J45901 Unspecified asthma with (acute) exacerbation: Secondary | ICD-10-CM

## 2014-07-15 LAB — POCT URINALYSIS DIPSTICK
Bilirubin, UA: NEGATIVE
GLUCOSE UA: NEGATIVE
KETONES UA: NEGATIVE
Nitrite, UA: POSITIVE
Protein, UA: NEGATIVE
Spec Grav, UA: 1.01
Urobilinogen, UA: 0.2
pH, UA: 6.5

## 2014-07-15 LAB — POCT UA - MICROSCOPIC ONLY
CASTS, UR, LPF, POC: NEGATIVE
Crystals, Ur, HPF, POC: NEGATIVE
Mucus, UA: NEGATIVE
YEAST UA: NEGATIVE

## 2014-07-15 LAB — POCT CBC
Granulocyte percent: 57.7 %G (ref 37–80)
HCT, POC: 40.3 % (ref 37.7–47.9)
HEMOGLOBIN: 13.7 g/dL (ref 12.2–16.2)
Lymph, poc: 3.1 (ref 0.6–3.4)
MCH: 30.7 pg (ref 27–31.2)
MCHC: 34 g/dL (ref 31.8–35.4)
MCV: 90.5 fL (ref 80–97)
MID (CBC): 0.4 (ref 0–0.9)
MPV: 7.9 fL (ref 0–99.8)
POC Granulocyte: 4.8 (ref 2–6.9)
POC LYMPH %: 37.6 % (ref 10–50)
POC MID %: 4.7 %M (ref 0–12)
Platelet Count, POC: 180 10*3/uL (ref 142–424)
RBC: 4.46 M/uL (ref 4.04–5.48)
RDW, POC: 13.5 %
WBC: 8.3 10*3/uL (ref 4.6–10.2)

## 2014-07-15 MED ORDER — ALBUTEROL SULFATE (2.5 MG/3ML) 0.083% IN NEBU
2.5000 mg | INHALATION_SOLUTION | Freq: Once | RESPIRATORY_TRACT | Status: AC
  Administered 2014-07-15: 2.5 mg via RESPIRATORY_TRACT

## 2014-07-15 MED ORDER — IPRATROPIUM BROMIDE 0.02 % IN SOLN
0.5000 mg | Freq: Once | RESPIRATORY_TRACT | Status: AC
  Administered 2014-07-15: 0.5 mg via RESPIRATORY_TRACT

## 2014-07-15 MED ORDER — PREDNISONE 20 MG PO TABS: 40.0000 mg | ORAL_TABLET | Freq: Every day | ORAL | Status: DC

## 2014-07-15 MED ORDER — HYDROCODONE-HOMATROPINE 5-1.5 MG/5ML PO SYRP: 5.0000 mL | ORAL_SOLUTION | Freq: Three times a day (TID) | ORAL | Status: DC | PRN

## 2014-07-15 MED ORDER — SULFAMETHOXAZOLE-TRIMETHOPRIM 800-160 MG PO TABS: 1.0000 | ORAL_TABLET | Freq: Two times a day (BID) | ORAL | Status: DC

## 2014-07-15 MED ORDER — VARENICLINE TARTRATE 0.5 MG X 11 & 1 MG X 42 PO MISC: ORAL | Status: DC

## 2014-07-15 NOTE — Patient Instructions (Addendum)
Pyelonephritis, Adult Pyelonephritis is a kidney infection. In general, there are 2 main types of pyelonephritis:  Infections that come on quickly without any warning (acute pyelonephritis).  Infections that persist for a long period of time (chronic pyelonephritis). CAUSES  Two main causes of pyelonephritis are:  Bacteria traveling from the bladder to the kidney. This is a problem especially in pregnant women. The urine in the bladder can become filled with bacteria from multiple causes, including:  Inflammation of the prostate gland (prostatitis).  Sexual intercourse in females.  Bladder infection (cystitis).  Bacteria traveling from the bloodstream to the tissue part of the kidney. Problems that may increase your risk of getting a kidney infection include:  Diabetes.  Kidney stones or bladder stones.  Cancer.  Catheters placed in the bladder.  Other abnormalities of the kidney or ureter. SYMPTOMS   Abdominal pain.  Pain in the side or flank area.  Fever.  Chills.  Upset stomach.  Blood in the urine (dark urine).  Frequent urination.  Strong or persistent urge to urinate.  Burning or stinging when urinating. DIAGNOSIS  Your caregiver may diagnose your kidney infection based on your symptoms. A urine sample may also be taken. TREATMENT  In general, treatment depends on how severe the infection is.   If the infection is mild and caught early, your caregiver may treat you with oral antibiotics and send you home.  If the infection is more severe, the bacteria may have gotten into the bloodstream. This will require intravenous (IV) antibiotics and a hospital stay. Symptoms may include:  High fever.  Severe flank pain.  Shaking chills.  Even after a hospital stay, your caregiver may require you to be on oral antibiotics for a period of time.  Other treatments may be required depending upon the cause of the infection. HOME CARE INSTRUCTIONS   Take your  antibiotics as directed. Finish them even if you start to feel better.  Make an appointment to have your urine checked to make sure the infection is gone.  Drink enough fluids to keep your urine clear or pale yellow.  Take medicines for the bladder if you have urgency and frequency of urination as directed by your caregiver. SEEK IMMEDIATE MEDICAL CARE IF:   You have a fever or persistent symptoms for more than 2-3 days.  You have a fever and your symptoms suddenly get worse.  You are unable to take your antibiotics or fluids.  You develop shaking chills.  You experience extreme weakness or fainting.  There is no improvement after 2 days of treatment. MAKE SURE YOU:  Understand these instructions.  Will watch your condition.  Will get help right away if you are not doing well or get worse. Document Released: 12/18/2004 Document Revised: 06/19/2011 Document Reviewed: 05/24/2010 [facility] Patient Information 2015 [place], [place]. This information is not intended to replace advice given to you by your health care provider. Make sure you discuss any questions you have with your health care provider.  Hay Fever Hay fever is an allergic reaction to particles in the air. It cannot be passed from person to person. It cannot be cured, but it can be controlled. CAUSES  Hay fever is caused by something that triggers an allergic reaction (allergens). The following are examples of allergens:  Ragweed.  Feathers.  Animal dander.  Grass and tree pollens.  Cigarette smoke.  House dust.  Pollution. SYMPTOMS   Sneezing.  Runny or stuffy nose.  Tearing eyes.  Itchy eyes, nose, mouth,  throat, skin, or other area.  Sore throat.  Headache.  Decreased sense of smell or taste. DIAGNOSIS Your caregiver will perform a physical exam and ask questions about the symptoms you are having.Allergy testing may be done to determine exactly what triggers your hay fever.  TREATMENT    Over-the-counter medicines may help symptoms. These include:  Antihistamines.  Decongestants. These may help with nasal congestion.  Your caregiver may prescribe medicines if over-the-counter medicines do not work.  Some people benefit from allergy shots when other medicines are not helpful. HOME CARE INSTRUCTIONS   Avoid the allergen that is causing your symptoms, if possible.  Take all medicine as told by your caregiver. SEEK MEDICAL CARE IF:   You have severe allergy symptoms and your current medicines are not helping.  Your treatment was working at one time, but you are now experiencing symptoms.  You have sinus congestion and pressure.  You develop a fever or headache.  You have thick nasal discharge.  You have asthma and have a worsening cough and wheezing. SEEK IMMEDIATE MEDICAL CARE IF:   You have swelling of your tongue or lips.  You have trouble breathing.  You feel lightheaded or like you are going to faint.  You have cold sweats.  You have a fever. Document Released: 12/18/2004 Document Revised: 03/12/2011 Document Reviewed: 03/15/2010 [facility] Patient Information 2015 [place], [place]. This information is not intended to replace advice given to you by your health care provider. Make sure you discuss any questions you have with your health care provider.  Chronic Obstructive Pulmonary Disease Exacerbation Chronic obstructive pulmonary disease (COPD) is a common lung condition in which airflow from the lungs is limited. COPD is a general term that can be used to describe many different lung problems that limit airflow, including chronic bronchitis and emphysema. COPD exacerbations are episodes when breathing symptoms become much worse and require extra treatment. Without treatment, COPD exacerbations can be life threatening, and frequent COPD exacerbations can cause further damage to your lungs. CAUSES   Respiratory infections.   Exposure to smoke.    Exposure to air pollution, chemical fumes, or dust. Sometimes there is no apparent cause or trigger. RISK FACTORS  Smoking cigarettes.  Older age.  Frequent prior COPD exacerbations. SIGNS AND SYMPTOMS   Increased coughing.   Increased thick spit (sputum) production.   Increased wheezing.   Increased shortness of breath.   Rapid breathing.   Chest tightness. DIAGNOSIS  Your medical history, a physical exam, and tests will help your health care provider make a diagnosis. Tests may include:  A chest X-ray.  Basic lab tests.  Sputum testing.  An arterial blood gas test. TREATMENT  Depending on the severity of your COPD exacerbation, you may need to be admitted to a hospital for treatment. Some of the treatments commonly used to treat COPD exacerbations are:   Antibiotic medicines.   Bronchodilators. These are drugs that expand the air passages. They may be given with an inhaler or nebulizer. Spacer devices may be needed to help improve drug delivery.  Corticosteroid medicines.  Supplemental oxygen therapy.  HOME CARE INSTRUCTIONS   Do not smoke. Quitting smoking is very important to prevent COPD from getting worse and exacerbations from happening as often.  Avoid exposure to all substances that irritate the airway, especially to tobacco smoke.   If you were prescribed an antibiotic medicine, finish it all even if you start to feel better.  Take all medicines as directed by your health care  provider.It is important to use correct technique with inhaled medicines.  Drink enough fluids to keep your urine clear or pale yellow (unless you have a medical condition that requires fluid restriction).  Use a cool mist vaporizer. This makes it easier to clear your chest when you cough.   If you have a home nebulizer and oxygen, continue to use them as directed.   Maintain all necessary vaccinations to prevent infections.   Exercise regularly.   Eat a  healthy diet.   Keep all follow-up appointments as directed by your health care provider. SEEK IMMEDIATE MEDICAL CARE IF:  You have worsening shortness of breath.   You have trouble talking.   You have severe chest pain.  You have blood in your sputum.  You have a fever.  You have weakness, vomit repeatedly, or faint.   You feel confused.   You continue to get worse. MAKE SURE YOU:   Understand these instructions.  Will watch your condition.  Will get help right away if you are not doing well or get worse. Document Released: 10/15/2006 Document Revised: 05/04/2013 Document Reviewed: 08/22/2012 [facility] Patient Information 2015 [place], [place]. This information is not intended to replace advice given to you by your health care provider. Make sure you discuss any questions you have with your health care provider.

## 2014-07-15 NOTE — Progress Notes (Addendum)
Subjective:  This chart was scribed for Delman Cheadle, MD by Leandra Kern, Medical Scribe. This patient was seen in Room 1 and the patient's care was started at 1:18 PM.  Chief Complaint  Patient presents with  . Sinus Problem    x 1 week     Patient ID: Jenna Chapman, female    DOB: Jun 30, 1970, 44 y.o.   MRN: [id number]  HPI HPI Comments: Jenna Chapman is a 44 y.o. female who presents to [facility] complaining of sinus problems.  Pt was seen 3 weeks ago for constipation, and was treated with Cipro for 10 days by Tishira. She was also treated with Prednisone taper 1 month ago for migraines by her neurologist.   Today, pt notes that symptoms started with watery eyes, and severe nasal congestions, post nasal drip, chest pain, productive cough, nausea. She reports that coughing causes the chest pain. Pt report that she tried Flonase. She states that she has been sleeping fine. However, she has not been eating much. Pt denies any ear pain, vision changes, trouble swallowing, headache, vomiting, constipation, or diarrhea.   Pt is currently a smoker. She notes that she uses an inhaler due to her frequent shortness of breath.   Past Medical History  Diagnosis Date  . HA (headache)    Current Outpatient Prescriptions on File Prior to Visit  Medication Sig Dispense Refill  . nortriptyline (PAMELOR) 10 MG capsule Take 3 capsules (30 mg total) by mouth at bedtime. 90 capsule 6  . ciprofloxacin (CIPRO) 500 MG tablet Take 1 tablet (500 mg total) by mouth 2 (two) times daily. (Patient not taking: Reported on 07/15/2014) 20 tablet 0  . docusate sodium (COLACE) 100 MG capsule Take 1 capsule (100 mg total) by mouth 2 (two) times daily. (Patient not taking: Reported on 07/15/2014) 10 capsule 0  . eletriptan (RELPAX) 40 MG tablet Take 1 tablet (40 mg total) by mouth as needed for migraine or headache. May repeat in 2 hours if headache persists or recurs. (Patient not taking: Reported  on 06/24/2014) 10 tablet 4  . Naproxen Sodium (ALEVE PO) Take by mouth as needed.    . polyethylene glycol powder (GLYCOLAX/MIRALAX) powder Take 17 g by mouth 2 (two) times daily as needed. (Patient not taking: Reported on 07/15/2014) 3350 g 1  . predniSONE (DELTASONE) 10 MG tablet Take 1 tablet (10 mg total) by mouth daily with breakfast. 6 day dose pack take as directed (Patient not taking: Reported on 06/24/2014) 21 tablet 0   No current facility-administered medications on file prior to visit.   No Known Allergies   Review of Systems  HENT: Positive for congestion, postnasal drip and sinus pressure. Negative for ear pain and trouble swallowing.   Eyes: Negative for visual disturbance.  Respiratory: Positive for cough.   Cardiovascular: Positive for chest pain.  Gastrointestinal: Positive for nausea. Negative for vomiting, diarrhea and constipation.       Objective:   Physical Exam  Constitutional: She is oriented to person, place, and time. She appears well-developed and well-nourished. No distress.  HENT:  Head: Normocephalic and atraumatic.  Right Ear: Tympanic membrane is injected.  Left Ear: A middle ear effusion is present.  Nose: Mucosal edema present.  Mouth/Throat: Posterior oropharyngeal edema (mild) present.  Clear post nasal drip  Eyes: EOM are normal. Pupils are equal, round, and reactive to light.  Neck: Neck supple. No thyromegaly present.  Cardiovascular: Normal rate, regular rhythm, S1 normal  and S2 normal.  Exam reveals no gallop and no friction rub.   No murmur heard. Pulmonary/Chest: Effort normal. She has wheezes.  Coarse inspiratory rhonchi over all,  with wheeze on left. Good air movement. Normal expiratory phase.   Abdominal: Bowel sounds are normal. There is CVA tenderness (mid left).  Lymphadenopathy:       Right: No supraclavicular adenopathy present.       Left: No supraclavicular adenopathy present.  Neurological: She is alert and oriented to  person, place, and time. No cranial nerve deficit.  Skin: Skin is warm and dry.  Psychiatric: She has a normal mood and affect. Her behavior is normal.  Nursing note and vitals reviewed.  BP 118/76 mmHg  Pulse 72  Temp(Src) 97.9 F (36.6 C) (Oral)  Resp 16  Ht 5\' 7"  (1.702 m)  Wt 161 lb (73.029 kg)  BMI 25.21 kg/m2  SpO2 97%  After nebulizer treatment repeat exam at 2:05: Much improved eat movement, but much of inspiratory and expiratory wheezing worst on right lower lobe  [facility] reading (PRIMARY) by  Dr. Brigitte Pulse. CXR: coarsening of the interstitium in bilateral hilum; copd change, no acute infiltrate.  Results for orders placed or performed in visit on 07/15/14  POCT UA - Microscopic Only  Result Value Ref Range   WBC, Ur, HPF, POC 0-4    RBC, urine, microscopic 0-1    Bacteria, U Microscopic 2+    Mucus, UA neg    Epithelial cells, urine per micros 0-1    Crystals, Ur, HPF, POC neg    Casts, Ur, LPF, POC neg    Yeast, UA neg   POCT urinalysis dipstick  Result Value Ref Range   Color, UA yellow    Clarity, UA cloudy    Glucose, UA neg    Bilirubin, UA neg    Ketones, UA neg    Spec Grav, UA 1.010    Blood, UA moderate    pH, UA 6.5    Protein, UA neg    Urobilinogen, UA 0.2    Nitrite, UA positive    Leukocytes, UA Trace (A) Negative  POCT CBC  Result Value Ref Range   WBC 8.3 4.6 - 10.2 K/uL   Lymph, poc 3.1 0.6 - 3.4   POC LYMPH PERCENT 37.6 10 - 50 %L   MID (cbc) 0.4 0 - 0.9   POC MID % 4.7 0 - 12 %M   POC Granulocyte 4.8 2 - 6.9   Granulocyte percent 57.7 37 - 80 %G   RBC 4.46 4.04 - 5.48 M/uL   Hemoglobin 13.7 12.2 - 16.2 g/dL   HCT, POC 40.3 37.7 - 47.9 %   MCV 90.5 80 - 97 fL   MCH, POC 30.7 27 - 31.2 pg   MCHC 34.0 31.8 - 35.4 g/dL   RDW, POC 13.5 %   Platelet Count, POC 180 142 - 424 K/uL   MPV 7.9 0 - 99.8 fL       Assessment & Plan:  Urine culture from 3 wks ago showed e. Coli that was resistant to amp and Augmentin and cefazolin, but it was  sensitive to Cipro and Bactrim.  Will start on bactrim to also provide coverage for sinuses as well as pyelo. 1. Acute ethmoidal sinusitis, recurrence not specified   2. Acute bronchitis, unspecified organism   3. Reactive airway disease with acute exacerbation   4. Tobacco abuse   5. Acute pyelonephritis    ADDENDUM: Uclx shows coag  neg staph as cause of UTI - but it is resistant to cefazolin, cipro, levo, pcn, and trimeth/sulfa - was sensitive only to gent, nitrofurantoin, rifampin, tetracycline, and vanc so stopped bactrim and start macrobid 100 qid x 1 wk - recheck with repeat UA asap to ensure no progression of pyelo.  Orders Placed This Encounter  Procedures  . Urine culture  . DG Chest 2 View    Standing Status: Future     Number of Occurrences: 1     Standing Expiration Date: 07/15/2015    Order Specific Question:  Reason for Exam (SYMPTOM  OR DIAGNOSIS REQUIRED)    Answer:  wheezing and rales, worst RLL    Order Specific Question:  Is the patient pregnant?    Answer:  No    Order Specific Question:  Preferred imaging location?    Answer:  External  . POCT UA - Microscopic Only  . POCT urinalysis dipstick  . POCT CBC    Meds ordered this encounter  Medications  . albuterol (PROVENTIL) (2.5 MG/3ML) 0.083% nebulizer solution 2.5 mg    Sig:   . ipratropium (ATROVENT) nebulizer solution 0.5 mg    Sig:   . sulfamethoxazole-trimethoprim (BACTRIM DS,SEPTRA DS) 800-160 MG per tablet    Sig: Take 1 tablet by mouth 2 (two) times daily.    Dispense:  28 tablet    Refill:  0    I personally performed the services described in this documentation, which was scribed in my presence. The recorded information has been reviewed and considered, and addended by me as needed.  Delman Cheadle, MD MPH

## 2014-07-18 LAB — URINE CULTURE

## 2014-07-19 ENCOUNTER — Ambulatory Visit (INDEPENDENT_AMBULATORY_CARE_PROVIDER_SITE_OTHER): Payer: 59 | Admitting: Family Medicine

## 2014-07-19 ENCOUNTER — Telehealth: Payer: Self-pay

## 2014-07-19 VITALS — BP 123/73 | HR 65 | Temp 98.9°F | Resp 16 | Ht 67.0 in | Wt 163.5 lb

## 2014-07-19 DIAGNOSIS — R350 Frequency of micturition: Secondary | ICD-10-CM | POA: Diagnosis not present

## 2014-07-19 LAB — POCT URINALYSIS DIPSTICK
Bilirubin, UA: NEGATIVE
GLUCOSE UA: NEGATIVE
KETONES UA: NEGATIVE
LEUKOCYTES UA: NEGATIVE
Nitrite, UA: NEGATIVE
Protein, UA: NEGATIVE
Spec Grav, UA: 1.01
Urobilinogen, UA: 0.2
pH, UA: 6.5

## 2014-07-19 LAB — POCT UA - MICROSCOPIC ONLY
Casts, Ur, LPF, POC: NEGATIVE
Crystals, Ur, HPF, POC: NEGATIVE
MUCUS UA: NEGATIVE
Yeast, UA: NEGATIVE

## 2014-07-19 MED ORDER — NITROFURANTOIN MACROCRYSTAL 100 MG PO CAPS: 100.0000 mg | ORAL_CAPSULE | Freq: Four times a day (QID) | ORAL | Status: DC

## 2014-07-19 NOTE — Telephone Encounter (Signed)
error 

## 2014-07-19 NOTE — Addendum Note (Signed)
Addended by: Delman Cheadle on: 07/19/2014 03:22 PM   Modules accepted: Orders, Medications

## 2014-07-19 NOTE — Progress Notes (Signed)
  Subjective:  Patient ID: Jenna Chapman, female    DOB: 01-Mar-1970  Age: 44 y.o. MRN: [id number]  Patient came back in to discuss her urinary tract infection. It was a staph species that was fairly drug-resistant, including resistance to the Bactrim that she was placed on for her sinuses. She did not have a lot of pyuria the other day, but the culture grew a large colony count. She is feeling a little better. Her sinuses are improving. She does smoke. She had a chest x-ray the other day and I told her about it showing some some possible bronchitis. She has no nausea or vomiting. She has urinary frequency but no dysuria.   Objective:   Sinuses nontender. Chest clear. Heart regular without murmurs. No CVA tenderness. Abdomen soft and nontender.  Results for orders placed or performed in visit on 07/19/14  POCT urinalysis dipstick  Result Value Ref Range   Color, UA yellow    Clarity, UA clear    Glucose, UA negative    Bilirubin, UA negative    Ketones, UA negative    Spec Grav, UA 1.010    Blood, UA moderate    pH, UA 6.5    Protein, UA negative    Urobilinogen, UA 0.2    Nitrite, UA negative    Leukocytes, UA Negative Negative  POCT UA - Microscopic Only  Result Value Ref Range   WBC, Ur, HPF, POC 0-2    RBC, urine, microscopic 0-5    Bacteria, U Microscopic trace    Mucus, UA negative    Epithelial cells, urine per micros 0-2    Crystals, Ur, HPF, POC negative    Casts, Ur, LPF, POC negative    Yeast, UA negative      Assessment & Plan:   Assessment:  Urinary frequency Abnormal urine culture secondary to probable contaminant   Plan:  Patient Instructions  Continue current medications  Drink plenty of fluids  Dr. Brigitte Pulse had sent in a prescription for a second anabiotic, but it does not appear that that will be necessary. Just leave it on file at the pharmacy.  Plan to return for a follow-up urinalysis in 10-14 days to make sure that it is clear.   I will be gone  when the urine is repeated, and if there is an abnormality please speak with Dr. Brigitte Pulse about it  Jenna Adriano, MD 07/19/2014

## 2014-07-19 NOTE — Patient Instructions (Addendum)
Continue current medications  Drink plenty of fluids  Dr. Brigitte Pulse had sent in a prescription for a second anabiotic, but it does not appear that that will be necessary. Just leave it on file at the pharmacy.  Plan to return for a follow-up urinalysis in 10-14 days to make sure that it is clear.

## 2014-07-21 LAB — URINE CULTURE
Colony Count: NO GROWTH
Organism ID, Bacteria: NO GROWTH

## 2014-09-09 ENCOUNTER — Other Ambulatory Visit: Payer: Self-pay

## 2014-09-10 LAB — CYTOLOGY - PAP

## 2014-10-21 ENCOUNTER — Encounter: Payer: Self-pay | Admitting: Nurse Practitioner

## 2014-10-21 ENCOUNTER — Ambulatory Visit (INDEPENDENT_AMBULATORY_CARE_PROVIDER_SITE_OTHER): Payer: 59 | Admitting: Nurse Practitioner

## 2014-10-21 VITALS — BP 117/76 | HR 61 | Ht 67.0 in | Wt 160.2 lb

## 2014-10-21 DIAGNOSIS — G43009 Migraine without aura, not intractable, without status migrainosus: Secondary | ICD-10-CM | POA: Diagnosis not present

## 2014-10-21 DIAGNOSIS — R930 Abnormal findings on diagnostic imaging of skull and head, not elsewhere classified: Secondary | ICD-10-CM

## 2014-10-21 DIAGNOSIS — R51 Headache: Secondary | ICD-10-CM | POA: Diagnosis not present

## 2014-10-21 DIAGNOSIS — R519 Headache, unspecified: Secondary | ICD-10-CM
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
## 2014-10-21 NOTE — Patient Instructions (Signed)
Relpax acutely to continue  nortriptyline to 3 Night, this Is your  Preventive Medication  Follow-up in 4 months Repeat MRI of the brain in February 2017

## 2014-10-23 NOTE — Progress Notes (Signed)
I have reviewed and agreed above plan. 

## 2014-12-22 ENCOUNTER — Ambulatory Visit (INDEPENDENT_AMBULATORY_CARE_PROVIDER_SITE_OTHER): Payer: 59 | Admitting: Physician Assistant

## 2014-12-22 VITALS — BP 104/72 | HR 68 | Temp 98.7°F | Resp 16 | Ht 67.0 in | Wt 165.2 lb

## 2014-12-22 DIAGNOSIS — J069 Acute upper respiratory infection, unspecified: Secondary | ICD-10-CM

## 2014-12-22 MED ORDER — AZITHROMYCIN 250 MG PO TABS
ORAL_TABLET | ORAL | Status: DC
Start: 2014-12-22 — End: 2015-03-07

## 2014-12-22 MED ORDER — PSEUDOEPHEDRINE HCL 60 MG PO TABS: 60.0000 mg | ORAL_TABLET | Freq: Four times a day (QID) | ORAL | Status: DC | PRN

## 2014-12-22 NOTE — Patient Instructions (Signed)
Please hydrate with 64 oz of water per day, which is almost 4 regular sized water bottles.   Upper Respiratory Infection, Adult Most upper respiratory infections (URIs) are a viral infection of the air passages leading to the lungs. A URI affects the nose, throat, and upper air passages. The most common type of URI is nasopharyngitis and is typically referred to as "the common cold." URIs run their course and usually go away on their own. Most of the time, a URI does not require medical attention, but sometimes a bacterial infection in the upper airways can follow a viral infection. This is called a secondary infection. Sinus and middle ear infections are common types of secondary upper respiratory infections. Bacterial pneumonia can also complicate a URI. A URI can worsen asthma and chronic obstructive pulmonary disease (COPD). Sometimes, these complications can require emergency medical care and may be life threatening.  CAUSES Almost all URIs are caused by viruses. A virus is a type of germ and can spread from one person to another.  RISKS FACTORS You may be at risk for a URI if:   You smoke.   You have chronic heart or lung disease.  You have a weakened defense (immune) system.   You are very young or very old.   You have nasal allergies or asthma.  You work in crowded or poorly ventilated areas.  You work in health care facilities or schools. SIGNS AND SYMPTOMS  Symptoms typically develop 2-3 days after you come in contact with a cold virus. Most viral URIs last 7-10 days. However, viral URIs from the influenza virus (flu virus) can last 14-18 days and are typically more severe. Symptoms may include:   Runny or stuffy (congested) nose.   Sneezing.   Cough.   Sore throat.   Headache.   Fatigue.   Fever.   Loss of appetite.   Pain in your forehead, behind your eyes, and over your cheekbones (sinus pain).  Muscle aches.  DIAGNOSIS  Your health care provider  may diagnose a URI by:  Physical exam.  Tests to check that your symptoms are not due to another condition such as:  Strep throat.  Sinusitis.  Pneumonia.  Asthma. TREATMENT  A URI goes away on its own with time. It cannot be cured with medicines, but medicines may be prescribed or recommended to relieve symptoms. Medicines may help:  Reduce your fever.  Reduce your cough.  Relieve nasal congestion. HOME CARE INSTRUCTIONS   Take medicines only as directed by your health care provider.   Gargle warm saltwater or take cough drops to comfort your throat as directed by your health care provider.  Use a warm mist humidifier or inhale steam from a shower to increase air moisture. This may make it easier to breathe.  Drink enough fluid to keep your urine clear or pale yellow.   Eat soups and other clear broths and maintain good nutrition.   Rest as needed.   Return to work when your temperature has returned to normal or as your health care provider advises. You may need to stay home longer to avoid infecting others. You can also use a face mask and careful hand washing to prevent spread of the virus.  Increase the usage of your inhaler if you have asthma.   Do not use any tobacco products, including cigarettes, chewing tobacco, or electronic cigarettes. If you need help quitting, ask your health care provider. PREVENTION  The best way to protect yourself from  getting a cold is to practice good hygiene.   Avoid oral or hand contact with people with cold symptoms.   Wash your hands often if contact occurs.  There is no clear evidence that vitamin C, vitamin E, echinacea, or exercise reduces the chance of developing a cold. However, it is always recommended to get plenty of rest, exercise, and practice good nutrition.  SEEK MEDICAL CARE IF:   You are getting worse rather than better.   Your symptoms are not controlled by medicine.   You have chills.  You have  worsening shortness of breath.  You have brown or red mucus.  You have yellow or brown nasal discharge.  You have pain in your face, especially when you bend forward.  You have a fever.  You have swollen neck glands.  You have pain while swallowing.  You have white areas in the back of your throat. SEEK IMMEDIATE MEDICAL CARE IF:   You have severe or persistent:  Headache.  Ear pain.  Sinus pain.  Chest pain.  You have chronic lung disease and any of the following:  Wheezing.  Prolonged cough.  Coughing up blood.  A change in your usual mucus.  You have a stiff neck.  You have changes in your:  Vision.  Hearing.  Thinking.  Mood. MAKE SURE YOU:   Understand these instructions.  Will watch your condition.  Will get help right away if you are not doing well or get worse.   This information is not intended to replace advice given to you by your health care provider. Make sure you discuss any questions you have with your health care provider.   Document Released: 06/13/2000 Document Revised: 05/04/2014 Document Reviewed: 03/25/2013 Elsevier Interactive Patient Education [employer].

## 2014-12-22 NOTE — Progress Notes (Signed)
[facility] [address] [place] [address] [phone]  Date:  12/22/2014   Name:  Jenna Chapman   DOB:  01-03-1970   MRN:  [id number]  PCP:  Vickii Penna., MD   Chief Complaint  Patient presents with  . Sinusitis    5 days     History of Present Illness:  Jenna Chapman is a 44 y.o. female patient who presents to [facility] for cc of 5 days of nasal congestion, facial pain at the maxillary.  Initial symptoms started at work with sneezing followed by severe nasal congestion.  Maxillary sinus pain with clear drainage.  sinus pain is severe with radiating pain to teeth.  Cough is minimal and dry.  No fever, sob, or dyspnea.  There is no ear discomfort.  Patient was seen here with cough almost 2 weeks ago.  Given hycodan for viral etiology.  Patient states that the cough lasted for some time, and he she went for about a week before these symptoms erupted.  She has tried mucinex without much help.     Patient Active Problem List   Diagnosis Date Noted  . Allergic rhinitis, seasonal 06/16/2014  . 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 Abnormal MRI of head 03/16/2014  . Migraine without aura and without status migrainosus, not intractable 02/09/2014  . Neck pain 11/19/2013  . Abnormal MRI 11/07/2012  . Subdural hematoma ([place]) 07/18/2012  . HA (headache)     Past Medical History  Diagnosis Date  . HA (headache)     Past Surgical History  Procedure Laterality Date  . Abdominal hysterectomy      Social History  Substance Use Topics  . Smoking status: Current Every Day Smoker -- 1.00 packs/day for 20 years    Types: Cigarettes  . Smokeless tobacco: Never Used  . Alcohol Use: No    Family History  Problem Relation Age of Onset  . High Cholesterol Mother   . High blood pressure Mother   . Lung cancer Mother     06-15-14 diagnosed  . COPD Mother     No Known Allergies  Medication list has been reviewed and updated.  Current Outpatient Prescriptions  on File Prior to Visit  Medication Sig Dispense Refill  . nortriptyline (PAMELOR) 10 MG capsule Take 3 capsules (30 mg total) by mouth at bedtime. 90 capsule 6  . eletriptan (RELPAX) 40 MG tablet Take 1 tablet (40 mg total) by mouth as needed for migraine or headache. May repeat in 2 hours if headache persists or recurs. (Patient not taking: Reported on 12/22/2014) 10 tablet 4  . varenicline (CHANTIX STARTING MONTH PAK) 0.5 MG X 11 & 1 MG X 42 tablet Take one 0.5 mg tablet by mouth once daily for 3 days, then increase to one 0.5 mg tablet twice daily for 4 days, then increase to one 1 mg tablet twice daily. (Patient not taking: Reported on 10/21/2014) 53 tablet 0   No current facility-administered medications on file prior to visit.    ROS ROS otherwise unremarkable unless listed above.   Physical Examination: BP 104/72 mmHg  Pulse 68  Temp(Src) 98.7 F (37.1 C) (Oral)  Resp 16  Ht 5\' 7"  (1.702 m)  Wt 165 lb 3.2 oz (74.934 kg)  BMI 25.87 kg/m2  SpO2 99% Ideal Body Weight: Weight in (lb) to have BMI = 25: 159.3  Physical Exam  Constitutional: She is oriented to person, place, and time. She appears well-developed  and well-nourished. No distress.  HENT:  Head: Normocephalic and atraumatic.  Right Ear: Tympanic membrane, external ear and ear canal normal.  Left Ear: Tympanic membrane, external ear and ear canal normal.  Nose: Rhinorrhea present. Right sinus exhibits maxillary sinus tenderness. Right sinus exhibits no frontal sinus tenderness. Left sinus exhibits maxillary sinus tenderness. Left sinus exhibits no frontal sinus tenderness.  Mouth/Throat: No uvula swelling. Posterior oropharyngeal erythema (mild) present. No oropharyngeal exudate or posterior oropharyngeal edema.  Eyes: Conjunctivae and EOM are normal. Pupils are equal, round, and reactive to light.  Cardiovascular: Normal rate and regular rhythm.  Exam reveals no gallop, no distant heart sounds and no friction rub.   No  murmur heard. Pulmonary/Chest: Effort normal. No respiratory distress. She has no decreased breath sounds. She has no wheezes. She has no rhonchi.  Lymphadenopathy:       Head (right side): Posterior auricular adenopathy present. No submandibular, no tonsillar and no preauricular adenopathy present.       Head (left side): Tonsillar adenopathy present. No submandibular, no preauricular and no posterior auricular adenopathy present.    She has no cervical adenopathy.  Neurological: She is alert and oriented to person, place, and time.  Skin: She is not diaphoretic.  Psychiatric: She has a normal mood and affect. Her behavior is normal.     Assessment and Plan: Jenna Chapman is a 44 y.o. female who is here today for sinus pressure and cough.  Possibility that this is viral, but due to recent illness and severity of pain, I would like to treat at this time.    Acute upper respiratory infection - Plan: pseudoephedrine (SUDAFED) 60 MG tablet, azithromycin (ZITHROMAX) 250 MG tablet   Ivar Drape, PA-C [facility] 12/22/2014 12:18 PM

## 2015-02-21 ENCOUNTER — Ambulatory Visit: Payer: 59 | Admitting: Nurse Practitioner

## 2015-03-07 ENCOUNTER — Ambulatory Visit (INDEPENDENT_AMBULATORY_CARE_PROVIDER_SITE_OTHER): Payer: 59 | Admitting: Internal Medicine

## 2015-03-07 VITALS — BP 106/73 | HR 85 | Temp 98.1°F | Resp 18 | Ht 67.0 in | Wt 168.6 lb

## 2015-03-07 DIAGNOSIS — R350 Frequency of micturition: Secondary | ICD-10-CM | POA: Diagnosis not present

## 2015-03-07 DIAGNOSIS — R309 Painful micturition, unspecified: Secondary | ICD-10-CM | POA: Diagnosis not present

## 2015-03-07 LAB — POCT URINALYSIS DIP (MANUAL ENTRY)
BILIRUBIN UA: NEGATIVE
Bilirubin, UA: NEGATIVE
Glucose, UA: NEGATIVE
Nitrite, UA: NEGATIVE
PH UA: 6
Spec Grav, UA: >=1.03
Urobilinogen, UA: 0.2

## 2015-03-07 LAB — POC MICROSCOPIC URINALYSIS (UMFC)

## 2015-03-07 MED ORDER — SULFAMETHOXAZOLE-TRIMETHOPRIM 800-160 MG PO TABS: 1.0000 | ORAL_TABLET | Freq: Two times a day (BID) | ORAL | Status: DC

## 2015-03-07 NOTE — Progress Notes (Signed)
Subjective:  By signing my name below, I, [name], attest that this documentation has been prepared under the direction and in the presence of Tami Lin, MD. Electronically Signed: [name], [place]. 03/07/2015 , 5:34 PM .  Patient was seen in Room 9 .   Patient ID: Jenna Chapman, female    DOB: 1970/09/18, 45 y.o.   MRN: [id number] Chief Complaint  Patient presents with  . Dysuria    x 5 days   HPI Jenna Chapman is a 45 y.o. female who presents to [facility] complaining of dysuria that started 5 days ago. She reports having some chills and sweats today. Her last infection was a while back. She denies having hematuria or fever.   Her last period was 20 years ago; she had tubal ligation done and also a hysterectomy.   Patient Active Problem List   Diagnosis Date Noted  . Allergic rhinitis, seasonal 06/16/2014  . CA cervix ([place]) 06/16/2014  . Abnormal MRI of head 03/16/2014  . Migraine without aura and without status migrainosus, not intractable 02/09/2014  . Neck pain 11/19/2013  . Abnormal MRI 11/07/2012  . Subdural hematoma ([place]) 07/18/2012  . HA (headache)     Current outpatient prescriptions:  .  nortriptyline (PAMELOR) 10 MG capsule, Take 3 capsules (30 mg total) by mouth at bedtime., Disp: 90 capsule, Rfl: 6 .  eletriptan (RELPAX) 40 MG tablet, Take 1 tablet (40 mg total) by mouth as needed for migraine or headache. May repeat in 2 hours if headache persists or recurs. (Patient not taking: Reported on 12/22/2014), Disp: 10 tablet, Rfl: 4 .  sulfamethoxazole-trimethoprim (BACTRIM DS,SEPTRA DS) 800-160 MG tablet, Take 1 tablet by mouth 2 (two) times daily., Disp: 20 tablet, Rfl: 0 .  varenicline (CHANTIX STARTING MONTH PAK) 0.5 MG X 11 & 1 MG X 42 tablet, Take one 0.5 mg tablet by mouth once daily for 3 days, then increase to one 0.5 mg tablet twice daily for 4 days, then increase to one 1 mg tablet twice daily. (Patient not taking: Reported on 10/21/2014), Disp: 53  tablet, Rfl: 0  Review of Systems  Constitutional: Positive for chills and diaphoresis. Negative for fever, activity change and fatigue.  Gastrointestinal: Negative for nausea, vomiting and diarrhea.  Genitourinary: Positive for dysuria. Negative for urgency, frequency and hematuria.  Skin: Negative for rash and wound.      Objective:   Physical Exam  Constitutional: She is oriented to person, place, and time. She appears well-developed and well-nourished. No distress.  HENT:  Head: Normocephalic and atraumatic.  Eyes: EOM are normal. Pupils are equal, round, and reactive to light.  Neck: Neck supple.  Cardiovascular: Normal rate.   Pulmonary/Chest: Effort normal. No respiratory distress.  Abdominal: There is no CVA tenderness.  No cva tenderness to percussion  Musculoskeletal: Normal range of motion.  Neurological: She is alert and oriented to person, place, and time.  Skin: Skin is warm and dry.  Psychiatric: She has a normal mood and affect. Her behavior is normal.  Nursing note and vitals reviewed.   BP 106/73 mmHg  Pulse 85  Temp(Src) 98.1 F (36.7 C) (Oral)  Resp 18  Ht 5\' 7"  (1.702 m)  Wt 168 lb 9.6 oz (76.476 kg)  BMI 26.40 kg/m2  SpO2 98%    Results for orders placed or performed in visit on 03/07/15  POCT Microscopic Urinalysis ([facility])  Result Value Ref Range   WBC,UR,HPF,POC Many (A) None WBC/hpf   RBC,UR,HPF,POC Many (A)  None RBC/hpf   Bacteria None None, Too numerous to count   Mucus Present (A) Absent   Epithelial Cells, UR Per Microscopy Few (A) None, Too numerous to count cells/hpf  POCT urinalysis dipstick  Result Value Ref Range   Color, UA yellow yellow   Clarity, UA cloudy (A) clear   Glucose, UA negative negative   Bilirubin, UA negative negative   Ketones, POC UA negative negative   Spec Grav, UA >=1.030    Blood, UA large (A) negative   pH, UA 6.0    Protein Ur, POC =100 (A) negative   Urobilinogen, UA 0.2    Nitrite, UA Negative Negative    Leukocytes, UA moderate (2+) (A) Negative    Assessment & Plan:   Painful urination - Plan: POCT Microscopic Urinalysis ([facility]), POCT urinalysis dipstick, Urine culture  Frequency of urination - Plan: POCT Microscopic Urinalysis ([facility]), POCT urinalysis dipstick, Urine culture  UTI of greater than 5 days duration  Meds ordered this encounter  Medications  . sulfamethoxazole-trimethoprim (BACTRIM DS,SEPTRA DS) 800-160 MG tablet    Sig: Take 1 tablet by mouth 2 (two) times daily.    Dispense:  20 tablet    Refill:  0   I have completed the patient encounter in its entirety as documented by the scribe, with editing by me where necessary. Amrit Erck P. Laney Pastor, M.D.

## 2015-03-09 LAB — URINE CULTURE: Colony Count: 40000

## 2015-04-11 ENCOUNTER — Ambulatory Visit (INDEPENDENT_AMBULATORY_CARE_PROVIDER_SITE_OTHER): Payer: 59 | Admitting: Internal Medicine

## 2015-04-11 VITALS — BP 116/60 | HR 73 | Temp 98.2°F | Resp 16 | Ht 67.0 in | Wt 169.2 lb

## 2015-04-11 DIAGNOSIS — R300 Dysuria: Secondary | ICD-10-CM | POA: Diagnosis not present

## 2015-04-11 DIAGNOSIS — N10 Acute tubulo-interstitial nephritis: Secondary | ICD-10-CM | POA: Diagnosis not present

## 2015-04-11 LAB — POCT URINALYSIS DIP (MANUAL ENTRY)
Bilirubin, UA: NEGATIVE
Glucose, UA: NEGATIVE
Nitrite, UA: POSITIVE — AB
Protein Ur, POC: 100 — AB
SPEC GRAV UA: 1.02
Urobilinogen, UA: 1
pH, UA: 5.5

## 2015-04-11 LAB — POC MICROSCOPIC URINALYSIS (UMFC)

## 2015-04-11 MED ORDER — CEFTRIAXONE SODIUM 1 G IJ SOLR
1.0000 g | Freq: Once | INTRAMUSCULAR | Status: AC
  Administered 2015-04-11: 1 g via INTRAMUSCULAR

## 2015-04-11 MED ORDER — CIPROFLOXACIN HCL 500 MG PO TABS: 500.0000 mg | ORAL_TABLET | Freq: Two times a day (BID) | ORAL | Status: DC

## 2015-04-11 NOTE — Progress Notes (Signed)
Subjective:  By signing my name below, I, [name], attest that this documentation has been prepared under the direction and in the presence of Tami Lin, MD. Electronically Signed: [name], [place]. 04/11/2015 , 6:29 PM .  Patient was seen in Room 14 .   Patient ID: Jenna Chapman, female    DOB: 1970/08/07, 45 y.o.   MRN: [id number] Chief Complaint  Patient presents with  . Back Pain    lower back pain, x 3 days  . Dysuria  . Urinary Frequency   HPI Jenna Chapman is a 45 y.o. female who presents to [facility] complaining of low back pain with dysuria, urinary frequency and some abdominal pain that started 3 days ago. She's also having some chills while in the office. She denies history of kidney stones. She was seen for dysuria and urinary frequency about a month ago. She doesn't have any more periods, as she's had hysterectomy. She denies fever at this time.   She works as a Freight forwarder at [employer].   Patient Active Problem List   Diagnosis Date Noted  . Allergic rhinitis, seasonal 06/16/2014  . CA cervix ([place]) 06/16/2014  . Abnormal MRI of head 03/16/2014  . Migraine without aura and without status migrainosus, not intractable 02/09/2014  . Neck pain 11/19/2013  . Abnormal MRI 11/07/2012  . Subdural hematoma ([place]) 07/18/2012  . HA (headache)     Current outpatient prescriptions:  .  nortriptyline (PAMELOR) 10 MG capsule, Take 3 capsules (30 mg total) by mouth at bedtime., Disp: 90 capsule, Rfl: 6 .  eletriptan (RELPAX) 40 MG tablet, Take 1 tablet (40 mg total) by mouth as needed for migraine or headache. May repeat in 2 hours if headache persists or recurs. (Patient not taking: Reported on 12/22/2014), Disp: 10 tablet, Rfl: 4 .  varenicline (CHANTIX STARTING MONTH PAK) 0.5 MG X 11 & 1 MG X 42 tablet, Take one 0.5 mg tablet by mouth once daily for 3 days, then increase to one 0.5 mg tablet twice daily for 4 days, then increase to one 1 mg tablet twice daily. (Patient  not taking: Reported on 10/21/2014), Disp: 53 tablet, Rfl: 0  Review of Systems  Constitutional: Positive for chills and fatigue. Negative for fever.  Respiratory: Negative for cough.   Gastrointestinal: Positive for abdominal pain. Negative for nausea, vomiting and diarrhea.  Genitourinary: Positive for dysuria and frequency. Negative for urgency, hematuria and difficulty urinating.  Musculoskeletal: Positive for back pain.      Objective:   Physical Exam  Constitutional: She is oriented to person, place, and time. She appears well-developed and well-nourished. No distress.  HENT:  Head: Normocephalic and atraumatic.  Eyes: EOM are normal. Pupils are equal, round, and reactive to light.  Neck: Neck supple.  Cardiovascular: Normal rate.   Pulmonary/Chest: Effort normal. No respiratory distress.  Abdominal: She exhibits no mass. There is no tenderness.  Right flank tenderness to percussion  Musculoskeletal: Normal range of motion.  Neurological: She is alert and oriented to person, place, and time.  Skin: Skin is warm and dry.  Psychiatric: She has a normal mood and affect. Her behavior is normal.  Nursing note and vitals reviewed.  BP 116/60 mmHg  Pulse 73  Temp(Src) 98.2 F (36.8 C) (Oral)  Resp 16  Ht 5\' 7"  (1.702 m)  Wt 169 lb 3.2 oz (76.749 kg)  BMI 26.49 kg/m2  SpO2 98%  Results for orders placed or performed in visit on 04/11/15  POCT Microscopic  Urinalysis ([facility])  Result Value Ref Range   WBC,UR,HPF,POC Too numerous to count  (A) None WBC/hpf   RBC,UR,HPF,POC Many (A) None RBC/hpf   Bacteria Moderate (A) None, Too numerous to count   Mucus Present (A) Absent   Epithelial Cells, UR Per Microscopy Few (A) None, Too numerous to count cells/hpf  POCT urinalysis dipstick  Result Value Ref Range   Color, UA orange (A) yellow   Clarity, UA cloudy (A) clear   Glucose, UA negative negative   Bilirubin, UA negative negative   Ketones, POC UA trace (5) (A) negative    Spec Grav, UA 1.020    Blood, UA moderate (A) negative   pH, UA 5.5    Protein Ur, POC =100 (A) negative   Urobilinogen, UA 1.0    Nitrite, UA Positive (A) Negative   Leukocytes, UA moderate (2+) (A) Negative      Assessment & Plan:  I have completed the patient encounter in its entirety as documented by the scribe, with editing by me where necessary. Hind Chesler P. Laney Pastor, M.D.  .Acute pyelonephritis - Plan: Urine culture, cefTRIAXone (ROCEPHIN) injection 1 g, Then begin Cipro 500 twice a day for 10 days// this might be a relapse of her infection from early March versus a new infection versus a recurrent infection because of a nidus within the kidney. Follow-up will be essential.   will call with culture results to confirm sensitivities  Dysuria - Plan: POCT Microscopic Urinalysis ([facility]), POCT urinalysis dipstick

## 2015-04-11 NOTE — Patient Instructions (Signed)
     IF you received an x-ray today, you will receive an invoice from [facility]. Please contact [facility] at [phone] with questions or concerns regarding your invoice.   IF you received labwork today, you will receive an invoice from [facility]. Please contact [facility] at [phone] with questions or concerns regarding your invoice.   Our billing staff will not be able to assist you with questions regarding bills from these companies.  You will be contacted with the lab results as soon as they are available. The fastest way to get your results is to activate your My Chart account. Instructions are located on the last page of this paperwork. If you have not heard from us regarding the results in 2 weeks, please contact this office.      

## 2015-04-14 LAB — URINE CULTURE

## 2015-04-23 ENCOUNTER — Other Ambulatory Visit: Payer: Self-pay

## 2015-04-23 NOTE — Telephone Encounter (Signed)
Pt is requesting a new Rx for varenicline (CHANTIX STARTING MONTH PAK) 0.5 MG X 11 & 1 MG X 42 tablet; she lost her previous Rx.

## 2015-04-26 MED ORDER — VARENICLINE TARTRATE 0.5 MG X 11 & 1 MG X 42 PO MISC: ORAL | Status: DC

## 2015-04-26 NOTE — Telephone Encounter (Signed)
Yes, ok to refill since she was seen here sev wks ago.

## 2015-04-26 NOTE — Telephone Encounter (Signed)
Dr Brigitte Pulse, do you want to replace the Rx you wrote last July, or do you need to see pt back first?

## 2015-04-28 NOTE — Telephone Encounter (Signed)
Notified pt on VM that Rx was sent. 

## 2015-04-29 ENCOUNTER — Other Ambulatory Visit: Payer: Self-pay

## 2015-04-29 MED ORDER — VARENICLINE TARTRATE 0.5 MG X 11 & 1 MG X 42 PO MISC
ORAL | Status: DC
Start: 2015-04-29 — End: 2015-07-02

## 2015-04-29 NOTE — Telephone Encounter (Signed)
Pt called and LM that her Rx we sent yesterday wasn't received by pharm. I re-sent the Chantix and notified pt on Cell # VM. I could not reach her at number left on my VM and it is not in her demographics.

## 2015-05-05 ENCOUNTER — Telehealth: Payer: Self-pay

## 2015-05-05 NOTE — Telephone Encounter (Signed)
PA started on covermymeds. LMOM for pt CB to answer whether she has tried at least one nicotine replacement gum/lozenge/patch OTC, and if she has ever tried bupropion/Wellbutrin for smoking cessation?

## 2015-05-05 NOTE — Telephone Encounter (Signed)
Pt CB and reported that she has tried both OTC nicotine replacements and wellbutrin in the past and neither has been effective. Completed PA. Pending.

## 2015-05-09 NOTE — Telephone Encounter (Signed)
PA approved through 05/04/16. Notified pharm and asked them to notify pt when ready.

## 2015-06-12 ENCOUNTER — Other Ambulatory Visit: Payer: Self-pay | Admitting: Nurse Practitioner

## 2015-06-28 ENCOUNTER — Ambulatory Visit (HOSPITAL_COMMUNITY): Payer: Worker's Compensation

## 2015-06-28 ENCOUNTER — Ambulatory Visit (HOSPITAL_COMMUNITY)
Admission: EM | Admit: 2015-06-28 | Discharge: 2015-06-28 | Disposition: A | Discharge status: Home / Self Care | Payer: Worker's Compensation | Attending: Family Medicine | Admitting: Family Medicine

## 2015-06-28 ENCOUNTER — Encounter (HOSPITAL_COMMUNITY): Payer: Self-pay | Admitting: Emergency Medicine

## 2015-06-28 DIAGNOSIS — Z87891 Personal history of nicotine dependence: Secondary | ICD-10-CM | POA: Diagnosis not present

## 2015-06-28 DIAGNOSIS — M25531 Pain in right wrist: Secondary | ICD-10-CM | POA: Insufficient documentation

## 2015-06-28 DIAGNOSIS — S62101A Fracture of unspecified carpal bone, right wrist, initial encounter for closed fracture: Secondary | ICD-10-CM

## 2015-06-28 DIAGNOSIS — W19XXXA Unspecified fall, initial encounter: Secondary | ICD-10-CM | POA: Diagnosis not present

## 2015-06-28 DIAGNOSIS — S52501A Unspecified fracture of the lower end of right radius, initial encounter for closed fracture: Secondary | ICD-10-CM | POA: Insufficient documentation

## 2015-06-28 DIAGNOSIS — Z79899 Other long term (current) drug therapy: Secondary | ICD-10-CM | POA: Diagnosis not present

## 2015-06-28 MED ORDER — OXYCODONE-ACETAMINOPHEN 5-325 MG PO TABS: 1.0000 | ORAL_TABLET | ORAL | Status: DC | PRN

## 2015-06-28 NOTE — ED Provider Notes (Signed)
CSN: [id number]     Arrival date & time 06/28/15  1935 History   First MD Initiated Contact with Patient 06/28/15 2012     No chief complaint on file.  (Consider location/radiation/quality/duration/timing/severity/associated sxs/prior Treatment) HPI History obtained from patient: Location:  Right wrist Context/Duration:   Severity:   Quality: Timing:            Home Treatment:  Associated symptoms:   Family History: HTN-mother     Past Medical History  Diagnosis Date  . HA (headache)    Past Surgical History  Procedure Laterality Date  . Abdominal hysterectomy     Family History  Problem Relation Age of Onset  . High Cholesterol Mother   . High blood pressure Mother   . Lung cancer Mother     06-15-14 diagnosed  . COPD Mother    Social History  Substance Use Topics  . Smoking status: Former Smoker -- 1.00 packs/day for 20 years    Types: Cigarettes  . Smokeless tobacco: Never Used  . Alcohol Use: No   OB History    No data available     Review of Systems  Denies: HEADACHE, NAUSEA, ABDOMINAL PAIN, CHEST PAIN, CONGESTION, DYSURIA, SHORTNESS OF BREATH  Allergies  Review of patient's allergies indicates no known allergies.  Home Medications   Prior to Admission medications   Medication Sig Start Date End Date Taking? Authorizing Provider  ciprofloxacin (CIPRO) 500 MG tablet Take 1 tablet (500 mg total) by mouth 2 (two) times daily. Patient not taking: Reported on 06/28/2015 04/11/15   Leandrew Koyanagi, MD  eletriptan (RELPAX) 40 MG tablet Take 1 tablet (40 mg total) by mouth as needed for migraine or headache. May repeat in 2 hours if headache persists or recurs. Patient not taking: Reported on 12/22/2014 06/16/14   Dennie Bible, NP  nortriptyline (PAMELOR) 10 MG capsule TAKE THREE CAPSULES BY MOUTH AT BEDTIME 06/13/15   Dennie Bible, NP  varenicline (CHANTIX STARTING MONTH PAK) 0.5 MG X 11 & 1 MG X 42 tablet Take one 0.5 mg tab by mouth once daily  for 3 days, then one 0.5 mg tab twice daily for 4 days, then one 1 mg tab twice daily. Patient not taking: Reported on 06/28/2015 04/29/15   Shawnee Knapp, MD   Meds Ordered and Administered this Visit  Medications - No data to display  BP 153/71 mmHg  Pulse 70  Temp(Src) 98 F (36.7 C) (Oral)  Resp 16  SpO2 100% No data found.   Physical Exam NURSES NOTES AND VITAL SIGNS REVIEWED. CONSTITUTIONAL: Well developed, well nourished, no acute distress HEENT: normocephalic, atraumatic EYES: Conjunctiva normal NECK:normal ROM, supple, no adenopathy PULMONARY:No respiratory distress, normal effort ABDOMINAL: Soft, ND, NT BS+, No CVAT MUSCULOSKELETAL: Normal ROM of all extremities, Right wrist: SKIN: warm and dry without rash PSYCHIATRIC: Mood and affect, behavior are normal  ED Course  Procedures (including critical care time)  Labs Review Labs Reviewed - No data to display  Imaging Review Dg Wrist Complete Right  06/28/2015  CLINICAL DATA:  Pain after fall. EXAM: RIGHT WRIST - COMPLETE 3+ VIEW COMPARISON:  None. FINDINGS: There is a nondisplaced fracture through the distal radial metaphysis. The fracture extends into the radiocarpal joint. Soft tissue swelling is seen. IMPRESSION: Nondisplaced fracture through the distal radial metaphysis extending into the radiocarpal joint. Electronically Signed   By: Dorise Bullion III M.D   On: 06/28/2015 21:06    Xray results are reviewed with patient,  and used as part of MDM  Visual Acuity Review  Right Eye Distance:   Left Eye Distance:   Bilateral Distance:    Right Eye Near:   Left Eye Near:    Bilateral Near:        Call to Dr. Fredna Dow to alert him to this case. Patient is advised to call office tomorrow to set up an appointment. Percocet for pain Wrist splint applied by nursing staff Out of work note provided.  MDM   1. Wrist fracture, right, closed, initial encounter     Patient is reassured that there are no issues that  require transfer to higher level of care at this time or additional tests. Patient is advised to continue home symptomatic treatment. Patient is advised that if there are new or worsening symptoms to attend the emergency department, contact primary care provider, or return to UC. Instructions of care provided discharged home in stable condition.    THIS NOTE WAS GENERATED USING A VOICE RECOGNITION SOFTWARE PROGRAM. ALL REASONABLE EFFORTS  WERE MADE TO PROOFREAD THIS DOCUMENT FOR ACCURACY.  I have verbally reviewed the discharge instructions with the patient. A printed AVS was given to the patient.  All questions were answered prior to discharge.      Konrad Felix, PA 06/28/15 2116  Konrad Felix, PA 06/29/15 2200

## 2015-06-28 NOTE — Discharge Instructions (Signed)
Wrist Fracture °A wrist fracture is a break or crack in one of the bones of your wrist. Your wrist is made up of eight small bones at the palm of your hand (carpal bones) and two long bones that make up your forearm (radius and ulna). °CAUSES °· A direct blow to the wrist. °· Falling on an outstretched hand. °· Trauma, such as a car accident or a fall. °RISK FACTORS °Risk factors for wrist fracture include: °· Participating in contact and high-risk sports, such as skiing, biking, and ice skating. °· Taking steroid medicines. °· Smoking. °· Being female. °· Being Caucasian. °· Drinking more than three alcoholic beverages per day. °· Having low or lowered bone density (osteoporosis or osteopenia). °· Age. Older adults have decreased bone density. °· Women who have had menopause. °· History of previous fractures. °SIGNS AND SYMPTOMS °Symptoms of wrist fractures include tenderness, bruising, and inflammation. Additionally, the wrist may hang in an odd position or appear deformed. °DIAGNOSIS °Diagnosis may include: °· Physical exam. °· X-ray. °TREATMENT °Treatment depends on many factors, including the nature and location of the fracture, your age, and your activity level. Treatment for wrist fracture can be nonsurgical or surgical. °Nonsurgical Treatment °A plaster cast or splint may be applied to your wrist if the bone is in a good position. If the fracture is not in good position, it may be necessary for your health care provider to realign it before applying a splint or cast. Usually, a cast or splint will be worn for several weeks. °Surgical Treatment °Sometimes the position of the bone is so far out of place that surgery is required to apply a device to hold it together as it heals. Depending on the fracture, there are a number of options for holding the bone in place while it heals, such as a cast and metal pins. °HOME CARE INSTRUCTIONS °· Keep your injured wrist elevated and move your fingers as much as  possible. °· Do not put pressure on any part of your cast or splint. It may break. °· Use a plastic bag to protect your cast or splint from water while bathing or showering. Do not lower your cast or splint into water. °· Take medicines only as directed by your health care provider. °· Keep your cast or splint clean and dry. If it becomes wet, damaged, or suddenly feels too tight, contact your health care provider right away. °· Do not use any tobacco products including cigarettes, chewing tobacco, or electronic cigarettes. Tobacco can delay bone healing. If you need help quitting, ask your health care provider. °· Keep all follow-up visits as directed by your health care provider. This is important. °· Ask your health care provider if you should take supplements of calcium and vitamins C and D to promote bone healing. °SEEK MEDICAL CARE IF: °· Your cast or splint is damaged, breaks, or gets wet. °· You have a fever. °· You have chills. °· You have continued severe pain or more swelling than you did before the cast was put on. °SEEK IMMEDIATE MEDICAL CARE IF: °· Your hand or fingernails on the injured arm turn blue or gray, or feel cold or numb. °· You have decreased feeling in the fingers of your injured arm. °MAKE SURE YOU: °· Understand these instructions. °· Will watch your condition. °· Will get help right away if you are not doing well or get worse. °  °This information is not intended to replace advice given to you by your   health care provider. Make sure you discuss any questions you have with your health care provider. °  °Document Released: 09/27/2004 Document Revised: 09/08/2014 Document Reviewed: 01/05/2011 °Elsevier Interactive Patient Education ©2016 [employer] Inc[employer] ° ° °Cast or Splint Care °Casts and splints support injured limbs and keep bones from moving while they heal.  °HOME CARE °· Keep the cast or splint uncovered during the drying period. °¨ A plaster cast can take 24 to 48 hours to dry. °¨ A  fiberglass cast will dry in less than 1 hour. °· Do not rest the cast on anything harder than a pillow for 24 hours. °· Do not put weight on your injured limb. Do not put pressure on the cast. Wait for your doctor's approval. °· Keep the cast or splint dry. °¨ Cover the cast or splint with a plastic bag during baths or wet weather. °¨ If you have a cast over your chest and belly (trunk), take sponge baths until the cast is taken off. °¨ If your cast gets wet, dry it with a towel or blow dryer. Use the cool setting on the blow dryer. °· Keep your cast or splint clean. Wash a dirty cast with a damp cloth. °· Do not put any objects under your cast or splint. °· Do not scratch the skin under the cast with an object. If itching is a problem, use a blow dryer on a cool setting over the itchy area. °· Do not trim or cut your cast. °· Do not take out the padding from inside your cast. °· Exercise your joints near the cast as told by your doctor. °· Raise (elevate) your injured limb on 1 or 2 pillows for the first 1 to 3 days. °GET HELP IF: °· Your cast or splint cracks. °· Your cast or splint is too tight or too loose. °· You itch badly under the cast. °· Your cast gets wet or has a soft spot. °· You have a bad smell coming from the cast. °· You get an object stuck under the cast. °· Your skin around the cast becomes red or sore. °· You have new or more pain after the cast is put on. °GET HELP RIGHT AWAY IF: °· You have fluid leaking through the cast. °· You cannot move your fingers or toes. °· Your fingers or toes turn blue or white or are cool, painful, or puffy (swollen). °· You have tingling or lose feeling (numbness) around the injured area. °· You have bad pain or pressure under the cast. °· You have trouble breathing or have shortness of breath. °· You have chest pain. °  °This information is not intended to replace advice given to you by your health care provider. Make sure you discuss any questions you have with  your health care provider. °  °Document Released: 04/19/2010 Document Revised: 08/20/2012 Document Reviewed: 06/26/2012 °Elsevier Interactive Patient Education ©2016 Elsevier Inc. ° ° °

## 2015-06-28 NOTE — ED Notes (Signed)
Provided ice pack

## 2015-06-28 NOTE — ED Notes (Signed)
Ruby Cola , cma is certified to do work related/injury urine.  Ruby Cola, cma encouraged patient to talk to supervisor.  Afterwards, patient reported superviser has instructed her what to do tomorrow

## 2015-06-28 NOTE — ED Notes (Signed)
Patient reports slipping on a piece of meat on the floor.  Patient fell.  Bent wrist backward.  Right radial pulse 2 +.  No numbness or tingling.   Patient reports this is a work related injury.  Patient did not have paperwork from job site.

## 2015-07-02 ENCOUNTER — Ambulatory Visit (INDEPENDENT_AMBULATORY_CARE_PROVIDER_SITE_OTHER): Payer: 59 | Admitting: Family Medicine

## 2015-07-02 VITALS — BP 122/80 | HR 78 | Temp 97.7°F | Resp 18 | Ht 67.0 in | Wt 178.4 lb

## 2015-07-02 DIAGNOSIS — N3090 Cystitis, unspecified without hematuria: Secondary | ICD-10-CM

## 2015-07-02 DIAGNOSIS — R300 Dysuria: Secondary | ICD-10-CM | POA: Diagnosis not present

## 2015-07-02 LAB — POCT URINALYSIS DIP (MANUAL ENTRY)
Bilirubin, UA: NEGATIVE
GLUCOSE UA: NEGATIVE
Ketones, POC UA: NEGATIVE
NITRITE UA: NEGATIVE
PH UA: 8
Protein Ur, POC: NEGATIVE
Spec Grav, UA: 1.015
UROBILINOGEN UA: 0.2

## 2015-07-02 LAB — POC MICROSCOPIC URINALYSIS (UMFC): Mucus: ABSENT

## 2015-07-02 MED ORDER — AMOXICILLIN-POT CLAVULANATE 875-125 MG PO TABS: 1.0000 | ORAL_TABLET | Freq: Two times a day (BID) | ORAL | Status: DC

## 2015-07-02 MED ORDER — IPRATROPIUM BROMIDE 0.03 % NA SOLN: 2.0000 | Freq: Four times a day (QID) | NASAL | Status: DC

## 2015-07-02 NOTE — Progress Notes (Addendum)
Subjective:  By signing my name below, I, [name], attest that this documentation has been prepared under the direction and in the presence of Delman Cheadle, MD. Electronically Signed: [name], [place]. 07/02/2015 , 3:03 PM .  Patient was seen in Room 10 .   Patient ID: Jenna Chapman, female    DOB: 08-Jun-1970, 45 y.o.   MRN: [id number] Chief Complaint  Patient presents with  . Dysuria    x 3 weeks (odor noticed also)  . URI    C/O runny nose, nasal stuffiness, & head pressure x 3 days   HPI Jenna Chapman is a 45 y.o. female who presents to [facility] complaining of dysuria with "weird odor" ongoing for 3 weeks. Patient has had recurrent problems with UTI's in March and April of this year. She was treated with rocephin, cipro and bactrim. Initial culture was GBS, followed by e coli that was resistant to ampicillin and bactrim.   Patient noticed a weird odor with her urine that started about 3 weeks ago. She also feels an "itchy sensation" when she urinates. She denies urinary frequency, nausea, vomiting, abdominal pain, back pain, pelvic pain, fever, chills, vaginal discharge or bowel symptoms. She denies using OTC medications for this.   Patient also reports having URI symptoms that started with sore throat 3 days ago. Afterwards, she noticed left maxillary sinus tenderness with nasal congestion and cough. She denies using nasal sprays or rinses for relief.   Patient also has a splint over her right wrist placed yesterday. She fell while at work a few days ago. She is right hand dominant.   Past Medical History  Diagnosis Date  . HA (headache)    Prior to Admission medications   Medication Sig Start Date End Date Taking? Authorizing Provider  nortriptyline (PAMELOR) 10 MG capsule TAKE THREE CAPSULES BY MOUTH AT BEDTIME 06/13/15  Yes Dennie Bible, NP  oxyCODONE-acetaminophen (PERCOCET/ROXICET) 5-325 MG tablet Take 1 tablet by mouth every 4 (four) hours as needed for severe  pain. 06/28/15  Yes Konrad Felix, PA  eletriptan (RELPAX) 40 MG tablet Take 1 tablet (40 mg total) by mouth as needed for migraine or headache. May repeat in 2 hours if headache persists or recurs. Patient not taking: Reported on 12/22/2014 06/16/14   Dennie Bible, NP   No Known Allergies  Review of Systems  Constitutional: Negative for fever, chills and fatigue.  HENT: Positive for rhinorrhea, sinus pressure and sore throat.   Gastrointestinal: Negative for nausea, vomiting, abdominal pain and diarrhea.  Genitourinary: Positive for dysuria. Negative for frequency, hematuria, vaginal discharge and pelvic pain.  Musculoskeletal: Negative for back pain.       Objective:   Physical Exam  Constitutional: She is oriented to person, place, and time. She appears well-developed and well-nourished. No distress.  HENT:  Head: Normocephalic and atraumatic.  Right Ear: Tympanic membrane normal.  Left Ear: Tympanic membrane normal.  Nose: Rhinorrhea present.  Mouth/Throat: Posterior oropharyngeal edema and posterior oropharyngeal erythema present.  clear postnasal drip in post oropharynx  Eyes: EOM are normal. Pupils are equal, round, and reactive to light.  Neck: Neck supple. No thyromegaly present.  Cardiovascular: Normal rate, regular rhythm and normal heart sounds.   No murmur heard. Pulmonary/Chest: Effort normal and breath sounds normal. No respiratory distress.  Abdominal: There is no CVA tenderness.  Musculoskeletal: Normal range of motion.  Lymphadenopathy:    She has no cervical adenopathy.  Neurological: She is alert and oriented  to person, place, and time.  Skin: Skin is warm and dry.  Psychiatric: She has a normal mood and affect. Her behavior is normal.  Nursing note and vitals reviewed.   BP 122/80 mmHg  Pulse 78  Temp(Src) 97.7 F (36.5 C) (Oral)  Resp 18  Ht 5\' 7"  (1.702 m)  Wt 178 lb 6 oz (80.91 kg)  BMI 27.93 kg/m2  SpO2 99%   Results for orders placed  or performed in visit on 07/02/15  POCT urinalysis dipstick  Result Value Ref Range   Color, UA yellow yellow   Clarity, UA hazy (A) clear   Glucose, UA negative negative   Bilirubin, UA negative negative   Ketones, POC UA negative negative   Spec Grav, UA 1.015    Blood, UA small (A) negative   pH, UA 8.0    Protein Ur, POC negative negative   Urobilinogen, UA 0.2    Nitrite, UA Negative Negative   Leukocytes, UA large (3+) (A) Negative  POCT Microscopic Urinalysis ([facility])  Result Value Ref Range   WBC,UR,HPF,POC Many (A) None WBC/hpf   RBC,UR,HPF,POC Few (A) None RBC/hpf   Bacteria Many (A) None, Too numerous to count   Mucus Absent Absent   Epithelial Cells, UR Per Microscopy Few (A) None, Too numerous to count cells/hpf       Assessment & Plan:   1. Burning with urination   2. Cystitis   Initially put on Augmentin as had last been treated with cipro <3 mos prior. UClx returned showing E. Coli resistent to Augmentin and keflex but sensitive to all else so changed to cipro.  Orders Placed This Encounter  Procedures  . Urine culture  . POCT urinalysis dipstick  . POCT Microscopic Urinalysis ([facility])    Meds ordered this encounter  Medications  . amoxicillin-clavulanate (AUGMENTIN) 875-125 MG tablet    Sig: Take 1 tablet by mouth 2 (two) times daily.    Dispense:  20 tablet    Refill:  0  . ipratropium (ATROVENT) 0.03 % nasal spray    Sig: Place 2 sprays into the nose 4 (four) times daily.    Dispense:  30 mL    Refill:  1    I personally performed the services described in this documentation, which was scribed in my presence. The recorded information has been reviewed and considered, and addended by me as needed.   Delman Cheadle, M.D.  [facility] [address] [place], [place] [address] [phone] phone [phone] fax  07/06/2015 7:35 AM

## 2015-07-02 NOTE — Patient Instructions (Addendum)
   IF you received an x-ray today, you will receive an invoice from [facility]. Please contact [facility] at [phone] with questions or concerns regarding your invoice.   IF you received labwork today, you will receive an invoice from [facility]. Please contact [facility] at [phone] with questions or concerns regarding your invoice.   Our billing staff will not be able to assist you with questions regarding bills from these companies.  You will be contacted with the lab results as soon as they are available. The fastest way to get your results is to activate your My Chart account. Instructions are located on the last page of this paperwork. If you have not heard from us regarding the results in 2 weeks, please contact this office.     Sinusitis, Adult Sinusitis is redness, soreness, and inflammation of the paranasal sinuses. Paranasal sinuses are air pockets within the bones of your face. They are located beneath your eyes, in the middle of your forehead, and above your eyes. In healthy paranasal sinuses, mucus is able to drain out, and air is able to circulate through them by way of your nose. However, when your paranasal sinuses are inflamed, mucus and air can become trapped. This can allow bacteria and other germs to grow and cause infection. Sinusitis can develop quickly and last only a short time (acute) or continue over a long period (chronic). Sinusitis that lasts for more than 12 weeks is considered chronic. CAUSES Causes of sinusitis include:  Allergies.  Structural abnormalities, such as displacement of the cartilage that separates your nostrils (deviated septum), which can decrease the air flow through your nose and sinuses and affect sinus drainage.  Functional abnormalities, such as when the small hairs (cilia) that line your sinuses and help remove mucus do not work properly or are not present. SIGNS AND SYMPTOMS Symptoms  of acute and chronic sinusitis are the same. The primary symptoms are pain and pressure around the affected sinuses. Other symptoms include:  Upper toothache.  Earache.  Headache.  Bad breath.  Decreased sense of smell and taste.  A cough, which worsens when you are lying flat.  Fatigue.  Fever.  Thick drainage from your nose, which often is green and may contain pus (purulent).  Swelling and warmth over the affected sinuses. DIAGNOSIS Your health care provider will perform a physical exam. During your exam, your health care provider may perform any of the following to help determine if you have acute sinusitis or chronic sinusitis:  Look in your nose for signs of abnormal growths in your nostrils (nasal polyps).  Tap over the affected sinus to check for signs of infection.  View the inside of your sinuses using an imaging device that has a light attached (endoscope). If your health care provider suspects that you have chronic sinusitis, one or more of the following tests may be recommended:  Allergy tests.  Nasal culture. A sample of mucus is taken from your nose, sent to a lab, and screened for bacteria.  Nasal cytology. A sample of mucus is taken from your nose and examined by your health care provider to determine if your sinusitis is related to an allergy. TREATMENT Most cases of acute sinusitis are related to a viral infection and will resolve on their own within 10 days. Sometimes, medicines are prescribed to help relieve symptoms of both acute and chronic sinusitis. These may include pain medicines, decongestants, nasal steroid sprays, or saline sprays. However, for sinusitis related   to a bacterial infection, your health care provider will prescribe antibiotic medicines. These are medicines that will help kill the bacteria causing the infection. Rarely, sinusitis is caused by a fungal infection. In these cases, your health care provider will prescribe antifungal  medicine. For some cases of chronic sinusitis, surgery is needed. Generally, these are cases in which sinusitis recurs more than 3 times per year, despite other treatments. HOME CARE INSTRUCTIONS  Drink plenty of water. Water helps thin the mucus so your sinuses can drain more easily.  Use a humidifier.  Inhale steam 3-4 times a day (for example, sit in the bathroom with the shower running).  Apply a warm, moist washcloth to your face 3-4 times a day, or as directed by your health care provider.  Use saline nasal sprays to help moisten and clean your sinuses.  Take medicines only as directed by your health care provider.  If you were prescribed either an antibiotic or antifungal medicine, finish it all even if you start to feel better. SEEK IMMEDIATE MEDICAL CARE IF:  You have increasing pain or severe headaches.  You have nausea, vomiting, or drowsiness.  You have swelling around your face.  You have vision problems.  You have a stiff neck.  You have difficulty breathing.   This information is not intended to replace advice given to you by your health care provider. Make sure you discuss any questions you have with your health care provider.   Document Released: 12/18/2004 Document Revised: 01/08/2014 Document Reviewed: 01/02/2011 Elsevier Interactive Patient Education 2016 Elsevier Inc.  

## 2015-07-05 LAB — URINE CULTURE

## 2015-07-06 MED ORDER — CIPROFLOXACIN HCL 500 MG PO TABS: 500.0000 mg | ORAL_TABLET | Freq: Two times a day (BID) | ORAL | Status: DC

## 2015-08-05 ENCOUNTER — Telehealth: Payer: Self-pay

## 2015-08-05 NOTE — Telephone Encounter (Signed)
Pt is needing a refill on her chantex   Best number [phone]

## 2015-08-06 ENCOUNTER — Other Ambulatory Visit: Payer: Self-pay | Admitting: Urgent Care

## 2015-08-06 MED ORDER — VARENICLINE TARTRATE 1 MG PO TABS: 1.0000 mg | ORAL_TABLET | Freq: Two times a day (BID) | ORAL | 2 refills | Status: DC

## 2015-08-06 NOTE — Telephone Encounter (Signed)
Patient stated she was taking the medication three months ago. She stated  Chantex helped her and now all of sudden she is craving a cigarette. She doesn't want to start smoking again. [phone].

## 2015-08-06 NOTE — Telephone Encounter (Signed)
It appears patient was on this in April 2017. I'm not sure if she has continued to be on this as her last office visit did not show the medication on her list. Please ask the patient what she is on and I will send the appropriate script. Also, I sent the script for Chantix by mistake. Please call the pharmacy and cancel this.

## 2015-08-10 NOTE — Telephone Encounter (Signed)
Jenna Chapman. I am okay with a chatix refill at 0.5 mg morning and 0.5 mg qhs for 30 days to help curb cravings.  Please check with Pamala Hurry as it looks like a PA was approved for the medication already.

## 2015-08-17 MED ORDER — VARENICLINE TARTRATE 0.5 MG PO TABS: 0.5000 mg | ORAL_TABLET | Freq: Two times a day (BID) | ORAL | 0 refills | Status: DC

## 2015-08-17 NOTE — Telephone Encounter (Signed)
Rx sent in

## 2015-11-11 ENCOUNTER — Other Ambulatory Visit: Payer: Self-pay | Admitting: Obstetrics and Gynecology

## 2015-11-11 DIAGNOSIS — Z803 Family history of malignant neoplasm of breast: Secondary | ICD-10-CM

## 2015-12-14 ENCOUNTER — Ambulatory Visit
Admission: RE | Admit: 2015-12-14 | Discharge: 2015-12-14 | Disposition: A | Discharge status: Home / Self Care | Payer: 59 | Source: Ambulatory Visit | Attending: Obstetrics and Gynecology | Admitting: Obstetrics and Gynecology

## 2015-12-14 DIAGNOSIS — Z803 Family history of malignant neoplasm of breast: Secondary | ICD-10-CM

## 2015-12-14 MED ORDER — GADOBENATE DIMEGLUMINE 529 MG/ML IV SOLN
20.0000 mL | Freq: Once | INTRAVENOUS | Status: AC | PRN
  Administered 2015-12-14: 20 mL via INTRAVENOUS

## 2016-01-05 ENCOUNTER — Ambulatory Visit (INDEPENDENT_AMBULATORY_CARE_PROVIDER_SITE_OTHER): Payer: 59 | Admitting: Urgent Care

## 2016-01-05 VITALS — BP 122/78 | HR 86 | Temp 97.9°F | Resp 16 | Ht 68.0 in | Wt 211.0 lb

## 2016-01-05 DIAGNOSIS — Z87891 Personal history of nicotine dependence: Secondary | ICD-10-CM | POA: Diagnosis not present

## 2016-01-05 DIAGNOSIS — Z23 Encounter for immunization: Secondary | ICD-10-CM | POA: Diagnosis not present

## 2016-01-05 DIAGNOSIS — R635 Abnormal weight gain: Secondary | ICD-10-CM

## 2016-01-05 MED ORDER — ORLISTAT 120 MG PO CAPS: 120.0000 mg | ORAL_CAPSULE | Freq: Three times a day (TID) | ORAL | 2 refills | Status: DC

## 2016-01-05 NOTE — Progress Notes (Signed)
    MRN: [id number] DOB: [birth year]  Subjective:   Jenna Chapman is a 46 y.o. female presenting for chief complaint of Weight Gain (Since she has quit smoking, would like to discuss weight loss options) and Flu Vaccine  Patient quit smoking 9 months ago. She underwent a trial of Chantix. Used to smoke 3ppd but Chantix helped her quit. Has steadily gained weight since then. Exercises daily, does treadmill and weight lifting. Practices healthy diet. Patient takes Paxil for "hot flashes", managed by her obstetrician Dr. Radene Knee. She started taking this 10/2015. She did have labs drawn with Dr. Radene Knee including blood sugar, thyroid checks which were both normal. Denies depressed mood, constipation, cold intolerance.  Jenna Chapman has a current medication list which includes the following prescription(s): paroxetine. Also has No Known Allergies.  Jenna Chapman  has a past medical history of HA (headache). Also  has a past surgical history that includes Abdominal hysterectomy.  Objective:   Vitals: BP 122/78   Pulse 86   Temp 97.9 F (36.6 C) (Oral)   Resp 16   Ht 5\' 8"  (1.727 m)   Wt 211 lb (95.7 kg)   SpO2 99%   BMI 32.08 kg/m   Wt Readings from Last 3 Encounters:  01/05/16 211 lb (95.7 kg)  07/02/15 178 lb 6 oz (80.9 kg)  04/11/15 169 lb 3.2 oz (76.7 kg)   Physical Exam  Constitutional: She is oriented to person, place, and time. She appears well-developed and well-nourished.  HENT:  Mouth/Throat: Oropharynx is clear and moist.  Eyes: No scleral icterus.  Neck: Normal range of motion. Neck supple. No thyromegaly present.  Cardiovascular: Normal rate, regular rhythm and intact distal pulses.  Exam reveals no gallop and no friction rub.   No murmur heard. Pulmonary/Chest: No respiratory distress. She has no wheezes. She has no rales.  Abdominal: Soft. Bowel sounds are normal. She exhibits no distension and no mass. There is no tenderness. There is no guarding.  Neurological: She is alert and  oriented to person, place, and time.  Skin: Skin is warm and dry.   Assessment and Plan :   This case was precepted with Dr. Mingo Amber.   1. Excessive weight gain - Comprehensive metabolic panel pending - Will have patient start orlistat. Follow up in 3 months or sooner if no weight loss or she still continues to experience weight gain. Maintain healthy diet and exercise. Consider Belviq if she fails orlistat.  2. History of smoking - Congratulated patient on her smoking cessation. Encouraged to continue to avoid situations and people that can make her want to smoke cigarettes again.  3. Needs flu shot - Flu Vaccine QUAD 36+ mos IM   Jaynee Eagles, PA-C [facility] [phone] 01/05/2016 3:31 PM

## 2016-01-05 NOTE — Patient Instructions (Addendum)
Orlistat capsules What is this medicine? Orlistat (OR li stat) is used to help obese people lose weight and keep the weight off while eating a reduced-calorie diet. This medicine decreases the amount of fat that is absorbed from your diet. This medicine may be used for other purposes; ask your health care provider or pharmacist if you have questions. COMMON BRAND NAME(S): alli, Xenical What should I tell my health care provider before I take this medicine? They need to know if you have any of these conditions: -an eating disorder, such as anorexia or bulimia -gallbladder problems or gallstones -HIV or AIDS -kidney stones -liver disease or hepatitis -organ transplant -pancreatitis -problems absorbing food -seizures -stomach or intestine problems -take medicines that treat or prevent blood clots -an unusual or allergic reaction to orlistat, other medicines, foods, dyes, supplements or preservatives -pregnant or trying to get pregnant -breast-feeding How should I use this medicine? Take this medicine by mouth with a glass of water. Follow the directions on the prescription label. Take this medicine with each main meal that contains about 30 percent of the calories from fat or one hour after the meal. Do not take your medicine more often than directed. If you occasionally miss a meal or have a meal without fat, you can skip that dose of this medicine. Talk to your pediatrician regarding the use of this medicine in children. Special care may be needed. While this drug may be prescribed for children as young as 12 years for selected conditions, precautions do apply. Overdosage: If you think you have taken too much of this medicine contact a poison control center or emergency room at once. NOTE: This medicine is only for you. Do not share this medicine with others. What if I miss a dose? If you miss a dose, take it within one hour following the meal that contains fat. If it is almost time for  your next dose, take only that dose. Do not take double or extra doses. What may interact with this medicine? -amiodarone -antiviral medicines for HIV or AIDS -certain medicines for seizures like carbamazepine, phenobarbital, phenytoin -certain medicines that treat or prevent blood clots like warfarin, enoxaparin, dalteparin, apixaban, dabigatran, and rivaroxaban -cyclosporine -diabetes medicines -dietary supplements like beta-carotene and vitamins A, D, E, and K -other weight loss products -thyroid hormones This list may not describe all possible interactions. Give your health care provider a list of all the medicines, herbs, non-prescription drugs, or dietary supplements you use. Also tell them if you smoke, drink alcohol, or use illegal drugs. Some items may interact with your medicine. What should I watch for while using this medicine? Do not use this medicine if you have had an organ transplant. This medicine interferes with some of the medicines used to prevent transplant rejection. This medicine can cause decreased absorption of some vitamins. You should take a daily multivitamin that contains normal amounts of vitamins D, E, K and beta-carotene or vitamin A. Take the multivitamin once per day at bedtime unless otherwise directed by your doctor or healthcare professional. You should use this medicine with a reduced-calorie diet that contains no more than about 30 percent of the calories from fat. Divide your daily intake of fat, carbohydrates, and protein evenly over your 3 main meals. Follow a well-balanced, reduced-calorie, low fat diet. Try starting this diet before taking this medicine. Following a low fat diet can help reduce the possible side effects from this medicine. Do not use this medicine if you are pregnant. Losing  weight while pregnant is not advised and may cause harm to the unborn child. Talk to your health care professional or pharmacist for more information. What side  effects may I notice from receiving this medicine? Side effects that you should report to your doctor or health care professional as soon as possible: -allergic reactions like skin rash, itching or hives, swelling of the face, lips, or tongue -breathing problems -bloody or black, tarry stools -signs of infection like fever or chills -signs and symptoms of kidney stones like blood in the urine; pain in the lower back or side; pain when urinating -signs and symptoms of liver injury like dark yellow or brown urine; general ill feeling or flu-like symptoms; light-colored stools; loss of appetite; nausea; right upper belly pain; unusually weak or tired; yellowing of the eyes or skin -trouble passing urine or change in the amount of urine -uncontrolled, urgent bowel movements -vomiting Side effects that usually do not require medical attention (report to your doctor or health care professional if they continue or are bothersome): -increased number of bowel movements -oily stools (bowel movements may be clear, orange or brown in color) -stomach discomfort, gas This list may not describe all possible side effects. Call your doctor for medical advice about side effects. You may report side effects to FDA at 1-800-FDA-1088. Where should I keep my medicine? Keep out of the reach of children. Storage at room temperature between 15 and 30 degrees C (59 and 86 degrees F). Keep container tightly closed. Throw away any unused medicine after the expiration date. NOTE: This sheet is a summary. It may not cover all possible information. If you have questions about this medicine, talk to your doctor, pharmacist, or health care provider.  2017 Elsevier/Gold Standard (2014-08-04 16:16:49)     IF you received an x-ray today, you will receive an invoice from [facility]. Please contact [facility] at [phone] with questions or concerns regarding your invoice.   IF you received labwork  today, you will receive an invoice from [facility]. Please contact [facility] at [phone] with questions or concerns regarding your invoice.   Our billing staff will not be able to assist you with questions regarding bills from these companies.  You will be contacted with the lab results as soon as they are available. The fastest way to get your results is to activate your My Chart account. Instructions are located on the last page of this paperwork. If you have not heard from [place] regarding the results in 2 weeks, please contact this office.

## 2016-01-06 LAB — COMPREHENSIVE METABOLIC PANEL
A/G RATIO: 2 (ref 1.2–2.2)
ALK PHOS: 90 IU/L (ref 39–117)
ALT: 15 IU/L (ref 0–32)
AST: 17 IU/L (ref 0–40)
Albumin: 4.5 g/dL (ref 3.5–5.5)
BILIRUBIN TOTAL: 0.4 mg/dL (ref 0.0–1.2)
BUN / CREAT RATIO: 16 (ref 9–23)
BUN: 16 mg/dL (ref 6–24)
CHLORIDE: 101 mmol/L (ref 96–106)
CO2: 29 mmol/L (ref 18–29)
Calcium: 9.8 mg/dL (ref 8.7–10.2)
Creatinine, Ser: 1.03 mg/dL — ABNORMAL HIGH (ref 0.57–1.00)
GFR calc non Af Amer: 65 mL/min/{1.73_m2} (ref >59)
GFR, EST AFRICAN AMERICAN: 75 mL/min/{1.73_m2} (ref >59)
GLUCOSE: 74 mg/dL (ref 65–99)
Globulin, Total: 2.2 g/dL (ref 1.5–4.5)
POTASSIUM: 4.5 mmol/L (ref 3.5–5.2)
Sodium: 144 mmol/L (ref 134–144)
Total Protein: 6.7 g/dL (ref 6.0–8.5)

## 2016-01-07 ENCOUNTER — Telehealth: Payer: Self-pay

## 2016-01-07 NOTE — Telephone Encounter (Signed)
Pt is needing to talk with someone about getting a different diet pill the one that was prescribed is too expensive   Best number [phone]

## 2016-01-09 NOTE — Telephone Encounter (Signed)
Patient called back after getting Mani's voicemail about her labs, states he thinks she is taking the weight loss pill he gave her but she is not because it cost to much and insurance doesn't cover it. She wants something else called in if she can or to have [facility] call her back to discuss.  Her call back number is [phone] and she uses the [facility] on [address].

## 2016-01-09 NOTE — Telephone Encounter (Signed)
Please advise 

## 2016-01-10 MED ORDER — LORCASERIN HCL 10 MG PO TABS: 1.0000 | ORAL_TABLET | Freq: Two times a day (BID) | ORAL | 2 refills | Status: DC

## 2016-01-10 NOTE — Telephone Encounter (Signed)
I discussed this at her last office visit with the patient. We can try lorcaserin (Belviq) but this may not be covered by her insurance either. These are the safest options. I will get the script ready for her and she can pick it up at her earliest convenience tomorrow, 01/11/2016. If she ends up taking this then we can re-evaluate in 3 months.

## 2016-01-11 NOTE — Telephone Encounter (Signed)
Left message script is ready for pick up.

## 2016-01-12 ENCOUNTER — Other Ambulatory Visit: Payer: Self-pay | Admitting: Emergency Medicine

## 2016-01-12 MED ORDER — LORCASERIN HCL 10 MG PO TABS: 1.0000 | ORAL_TABLET | Freq: Two times a day (BID) | ORAL | 2 refills | Status: DC

## 2016-01-13 NOTE — Telephone Encounter (Signed)
Patient wants to talk with [name] - the second prescription is $400.  Belviq  [phone]

## 2016-01-14 NOTE — Telephone Encounter (Signed)
Pt states the only medication insurance will cover is Phentermine. As discussed during last visit, she knows you do not want her to take medication but she wants to restart for a short period.  Please advise

## 2016-01-14 NOTE — Telephone Encounter (Signed)
Left message to return call 

## 2016-01-18 NOTE — Telephone Encounter (Signed)
Thank you for reviewing my previous notes with this patient Jenna Chapman. I would recommend the patient come and see a different provider. I will not prescribe this medication. There is too much risk to the patient given her extensive smoking history and potential for adverse effects with this medication including HTN, elevated heart rate, heart ischemia (no blood flow), abuse and dependency. If she cannot afford medical therapy with safe medications then I would recommend behavioral therapy. Otherwise, she can come in and see another provider that might consider prescribing this medication to her. However, this may not be the case and I hope we can set that expectation. Thank you!

## 2016-01-26 NOTE — Telephone Encounter (Signed)
L/m with manis response

## 2016-02-02 ENCOUNTER — Ambulatory Visit: Payer: 59 | Admitting: Urgent Care

## 2016-02-02 ENCOUNTER — Ambulatory Visit (INDEPENDENT_AMBULATORY_CARE_PROVIDER_SITE_OTHER): Payer: 59 | Admitting: Family Medicine

## 2016-02-02 VITALS — BP 118/76 | HR 91 | Temp 98.2°F | Resp 16 | Ht 68.0 in | Wt 214.0 lb

## 2016-02-02 DIAGNOSIS — H9202 Otalgia, left ear: Secondary | ICD-10-CM

## 2016-02-02 DIAGNOSIS — J329 Chronic sinusitis, unspecified: Secondary | ICD-10-CM

## 2016-02-02 MED ORDER — AMOXICILLIN-POT CLAVULANATE 875-125 MG PO TABS: 1.0000 | ORAL_TABLET | Freq: Two times a day (BID) | ORAL | 0 refills | Status: DC

## 2016-02-02 MED ORDER — IPRATROPIUM BROMIDE 0.03 % NA SOLN: 2.0000 | Freq: Two times a day (BID) | NASAL | 0 refills | Status: DC

## 2016-02-02 MED ORDER — HYDROCOD POLST-CPM POLST ER 10-8 MG/5ML PO SUER: 5.0000 mL | Freq: Two times a day (BID) | ORAL | 0 refills | Status: DC | PRN

## 2016-02-02 NOTE — Patient Instructions (Addendum)
Start Augmentin 1 tablet twice daily with food to avoid stomach upset x 10 days. Complete all medication.   Ipratropium (Atrovent) 2 sprays, twice daily for nasal congestion.  For cough, chlorpheniramine-hydrocodone (Tussionex) 5 ml every 12 hours. Medication causes drowsiness and or sedation.   IF you received an x-ray today, you will receive an invoice from [facility]. Please contact [facility] at [phone] with questions or concerns regarding your invoice.   IF you received labwork today, you will receive an invoice from [place]. Please contact [facility] at [phone] with questions or concerns regarding your invoice.   Our billing staff will not be able to assist you with questions regarding bills from these companies.  You will be contacted with the lab results as soon as they are available. The fastest way to get your results is to activate your My Chart account. Instructions are located on the last page of this paperwork. If you have not heard from [place] regarding the results in 2 weeks, please contact this office.     Sinusitis, Adult Sinusitis is soreness and inflammation of your sinuses. Sinuses are hollow spaces in the bones around your face. They are located:  Around your eyes.  In the middle of your forehead.  Behind your nose.  In your cheekbones. Your sinuses and nasal passages are lined with a stringy fluid (mucus). Mucus normally drains out of your sinuses. When your nasal tissues get inflamed or swollen, the mucus can get trapped or blocked so air cannot flow through your sinuses. This lets bacteria, viruses, and funguses grow, and that leads to infection. Follow these instructions at home: Medicines  Take, use, or apply over-the-counter and prescription medicines only as told by your doctor. These may include nasal sprays.  If you were prescribed an antibiotic medicine, take it as told by your doctor. Do not stop taking the antibiotic even if  you start to feel better. Hydrate and Humidify  Drink enough water to keep your pee (urine) clear or pale yellow.  Use a cool mist humidifier to keep the humidity level in your home above 50%.  Breathe in steam for 10-15 minutes, 3-4 times a day or as told by your doctor. You can do this in the bathroom while a hot shower is running.  Try not to spend time in cool or dry air. Rest  Rest as much as possible.  Sleep with your head raised (elevated).  Make sure to get enough sleep each night. General instructions  Put a warm, moist washcloth on your face 3-4 times a day or as told by your doctor. This will help with discomfort.  Wash your hands often with soap and water. If there is no soap and water, use hand sanitizer.  Do not smoke. Avoid being around people who are smoking (secondhand smoke).  Keep all follow-up visits as told by your doctor. This is important. Contact a doctor if:  You have a fever.  Your symptoms get worse.  Your symptoms do not get better within 10 days. Get help right away if:  You have a very bad headache.  You cannot stop throwing up (vomiting).  You have pain or swelling around your face or eyes.  You have trouble seeing.  You feel confused.  Your neck is stiff.  You have trouble breathing. This information is not intended to replace advice given to you by your health care provider. Make sure you discuss any questions you have with your health care provider. Document Released:  06/06/2007 Document Revised: 08/14/2015 Document Reviewed: 10/13/2014 Elsevier Interactive Patient Education  2017 [employer].

## 2016-02-02 NOTE — Progress Notes (Signed)
Patient ID: Jenna Chapman, female    DOB: 30-Aug-1970, 46 y.o.   MRN: [id number]  PCP: Vickii Penna., MD  Chief Complaint  Patient presents with  . Cough    X 10 days  . Nasal Congestion  . Sore Throat    Subjective:  HPI 46 year old female presents for evaluation of cough, nasal congestion, and sore throat over 1 week. Past medical hx include migraines, allergic rhinitis, and hx of cervical cancer. She has remained smoke free for over 11 months. Illness began with sore throat x 7 days ago then developed cough, nasal congestion, and left ear pressure. Denies fever, all though, she has had episodic chills. Has tried over the counter medication without relief of symptoms.  Social History   Social History  . Marital status: Married    Spouse name: Merry Proud  . Number of children: 2  . Years of education: 12   Occupational History  . [place]    [employer]   Social History Main Topics  . Smoking status: Former Smoker    Packs/day: 1.00    Years: 20.00    Types: Cigarettes  . Smokeless tobacco: Never Used     Comment: Quit in May 2017  . Alcohol use No  . Drug use: No  . Sexual activity: Not on file   Other Topics Concern  . Not on file   Social History Narrative   Patient lives at home with her husband Merry Proud). Patient works [employer]. Patient has a 12th grade education.   Caffeine- 0ne cup of caffeine.   Right handed.   Patient has two children.    Family History  Problem Relation Age of Onset  . High Cholesterol Mother   . High blood pressure Mother   . Lung cancer Mother     06-15-14 diagnosed  . COPD Mother    Review of Systems See HPI Patient Active Problem List   Diagnosis Date Noted  . Allergic rhinitis, seasonal 06/16/2014  . CA cervix ([place]) 06/16/2014  . Abnormal MRI of head 03/16/2014  . Migraine without aura and without status migrainosus, not intractable 02/09/2014  . Neck pain 11/19/2013  . Abnormal MRI 11/07/2012  .  Subdural hematoma ([place]) 07/18/2012  . HA (headache)     No Known Allergies  Prior to Admission medications   Medication Sig Start Date End Date Taking? Authorizing Provider  PARoxetine (PAXIL) 10 MG tablet Take 10 mg by mouth daily.   Yes Historical Provider, MD    Past Medical, Surgical Family and Social History reviewed and updated.    Objective:   Today's Vitals   02/02/16 1536  BP: 118/76  Pulse: 91  Resp: 16  Temp: 98.2 F (36.8 C)  TempSrc: Oral  SpO2: 98%  Weight: 214 lb (97.1 kg)  Height: 5\' 8"  (1.727 m)    Wt Readings from Last 3 Encounters:  02/02/16 214 lb (97.1 kg)  01/05/16 211 lb (95.7 kg)  07/02/15 178 lb 6 oz (80.9 kg)   Physical Exam  Constitutional: She is oriented to person, place, and time. She appears well-developed and well-nourished.  HENT:  Head: Normocephalic and atraumatic.  Right Ear: Hearing, tympanic membrane, external ear and ear canal normal.  Left Ear: Hearing normal. There is tenderness. Tympanic membrane is erythematous. A middle ear effusion is present.  Nose: Mucosal edema and rhinorrhea present.  Mouth/Throat: No oropharyngeal exudate, posterior oropharyngeal edema or posterior oropharyngeal erythema.  Neck: Normal range  of motion. Neck supple.  Cardiovascular: Normal rate, regular rhythm, normal heart sounds and intact distal pulses.   Pulmonary/Chest: Effort normal and breath sounds normal.  Lymphadenopathy:    She has no cervical adenopathy.  Neurological: She is alert and oriented to person, place, and time.  Skin: Skin is warm and dry.  Psychiatric: She has a normal mood and affect. Her behavior is normal. Judgment and thought content normal.      Assessment & Plan:  1. Sinusitis, unspecified chronicity, unspecified location 2. Left ear pain  Plan:  Start Augmentin 1 tablet twice daily with food to avoid stomach upset x 10 days. Complete all medication.   Ipratropium (Atrovent) 2 sprays, twice daily for nasal  congestion.  For cough, chlorpheniramine-hydrocodone (Tussionex) 5 ml every 12 hours. Medication causes drowsiness and or sedation.    Carroll Sage. Kenton Kingfisher, MSN, FNP-C [facility] [place]

## 2016-04-17 ENCOUNTER — Telehealth: Payer: Self-pay | Admitting: Neurology

## 2016-04-17 NOTE — Telephone Encounter (Signed)
Pt called said she has fallen Sunday and today. Said she is falling face 1st, said she has been stumbling for the past 3 weeks. An appt was scheduled for 08/01/16. Please call

## 2016-04-17 NOTE — Telephone Encounter (Signed)
Returned pt TC. Left VM for pt to call back and schedule sooner/work-in appt.

## 2016-04-17 NOTE — Telephone Encounter (Signed)
Patient is returning your call.  

## 2016-04-17 NOTE — Telephone Encounter (Signed)
Dr. Krista Blue pt, last seen by Hoyle Sauer 10/2014.

## 2016-04-17 NOTE — Telephone Encounter (Addendum)
Left message requesting return call.  Ok to schedule next available with Dr. Krista Blue when patient calls back.

## 2016-04-18 NOTE — Telephone Encounter (Signed)
Patient has been scheduled with Dr. Krista Blue 06/07/16 @ 7:30am.  Patient confirmed appointment and appointment 08/01/16 has been canceled.

## 2016-06-07 ENCOUNTER — Telehealth: Payer: Self-pay | Admitting: Neurology

## 2016-06-07 ENCOUNTER — Ambulatory Visit (INDEPENDENT_AMBULATORY_CARE_PROVIDER_SITE_OTHER): Payer: 59 | Admitting: Neurology

## 2016-06-07 ENCOUNTER — Encounter: Payer: Self-pay | Admitting: Neurology

## 2016-06-07 VITALS — BP 121/79 | HR 65 | Ht 68.0 in | Wt 204.0 lb

## 2016-06-07 DIAGNOSIS — R930 Abnormal findings on diagnostic imaging of skull and head, not elsewhere classified: Secondary | ICD-10-CM | POA: Diagnosis not present

## 2016-06-07 DIAGNOSIS — G43009 Migraine without aura, not intractable, without status migrainosus: Secondary | ICD-10-CM

## 2016-06-07 DIAGNOSIS — W19XXXS Unspecified fall, sequela: Secondary | ICD-10-CM

## 2016-06-07 DIAGNOSIS — W19XXXA Unspecified fall, initial encounter: Secondary | ICD-10-CM | POA: Insufficient documentation

## 2016-06-07 MED ORDER — TOPIRAMATE 100 MG PO TABS: 100.0000 mg | ORAL_TABLET | Freq: Two times a day (BID) | ORAL | 11 refills | Status: DC

## 2016-06-07 MED ORDER — SUMATRIPTAN SUCCINATE 50 MG PO TABS: 50.0000 mg | ORAL_TABLET | ORAL | 6 refills | Status: DC | PRN

## 2016-06-07 NOTE — Telephone Encounter (Signed)
Peer-to-peer scheduled for Friday, June 8th at 10:30am.  Dr. Laurena Spies will be calling Dr. Krista Blue on her mobile number to complete the case.

## 2016-06-07 NOTE — Telephone Encounter (Signed)
[employer] approved the  MRI brain was approved but not the MR Cervical spine w/wo contrast. The phone number for the peer to peer is [phone] and the case number is [id number].
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
## 2016-06-08 NOTE — Telephone Encounter (Signed)
Noted,  Thank you!

## 2016-06-08 NOTE — Telephone Encounter (Signed)
MRI cervical w/wo  [phone]

## 2016-06-12 LAB — LIPID PANEL
CHOL/HDL RATIO: 3 ratio (ref 0.0–4.4)
Cholesterol, Total: 136 mg/dL (ref 100–199)
HDL: 45 mg/dL (ref >39)
LDL CALC: 78 mg/dL (ref 0–99)
TRIGLYCERIDES: 67 mg/dL (ref 0–149)
VLDL Cholesterol Cal: 13 mg/dL (ref 5–40)

## 2016-06-12 LAB — CBC WITH DIFFERENTIAL/PLATELET
BASOS ABS: 0 10*3/uL (ref 0.0–0.2)
BASOS: 0 %
EOS (ABSOLUTE): 0 10*3/uL (ref 0.0–0.4)
Eos: 0 %
Hematocrit: 39.4 % (ref 34.0–46.6)
Hemoglobin: 12.9 g/dL (ref 11.1–15.9)
Immature Grans (Abs): 0 10*3/uL (ref 0.0–0.1)
Immature Granulocytes: 0 %
LYMPHS ABS: 2.1 10*3/uL (ref 0.7–3.1)
Lymphs: 42 %
MCH: 29.9 pg (ref 26.6–33.0)
MCHC: 32.7 g/dL (ref 31.5–35.7)
MCV: 91 fL (ref 79–97)
MONOS ABS: 0.4 10*3/uL (ref 0.1–0.9)
Monocytes: 7 %
NEUTROS ABS: 2.5 10*3/uL (ref 1.4–7.0)
Neutrophils: 51 %
PLATELETS: 169 10*3/uL (ref 150–379)
RBC: 4.31 x10E6/uL (ref 3.77–5.28)
RDW: 13.3 % (ref 12.3–15.4)
WBC: 5 10*3/uL (ref 3.4–10.8)

## 2016-06-12 LAB — COMPREHENSIVE METABOLIC PANEL
A/G RATIO: 1.9 (ref 1.2–2.2)
ALK PHOS: 83 IU/L (ref 39–117)
ALT: 13 IU/L (ref 0–32)
AST: 15 IU/L (ref 0–40)
Albumin: 4.4 g/dL (ref 3.5–5.5)
BILIRUBIN TOTAL: 0.4 mg/dL (ref 0.0–1.2)
BUN/Creatinine Ratio: 20 (ref 9–23)
BUN: 16 mg/dL (ref 6–24)
CHLORIDE: 104 mmol/L (ref 96–106)
CO2: 25 mmol/L (ref 18–29)
Calcium: 9.3 mg/dL (ref 8.7–10.2)
Creatinine, Ser: 0.8 mg/dL (ref 0.57–1.00)
GFR calc non Af Amer: 89 mL/min/{1.73_m2} (ref >59)
GFR, EST AFRICAN AMERICAN: 102 mL/min/{1.73_m2} (ref >59)
GLUCOSE: 91 mg/dL (ref 65–99)
Globulin, Total: 2.3 g/dL (ref 1.5–4.5)
POTASSIUM: 4.3 mmol/L (ref 3.5–5.2)
Sodium: 143 mmol/L (ref 134–144)
TOTAL PROTEIN: 6.7 g/dL (ref 6.0–8.5)

## 2016-06-12 LAB — VITAMIN B12: VITAMIN B 12: 691 pg/mL (ref 232–1245)

## 2016-06-12 LAB — TSH: TSH: 4.5 u[IU]/mL (ref 0.450–4.500)

## 2016-06-12 LAB — COPPER, SERUM: Copper: 102 ug/dL (ref 72–166)

## 2016-06-12 LAB — HEMOGLOBIN A1C
Est. average glucose Bld gHb Est-mCnc: 103 mg/dL
HEMOGLOBIN A1C: 5.2 % (ref 4.8–5.6)

## 2016-06-12 LAB — RPR: RPR Ser Ql: NONREACTIVE

## 2016-06-12 LAB — CK: Total CK: 72 U/L (ref 24–173)

## 2016-06-13 ENCOUNTER — Telehealth: Payer: Self-pay | Admitting: *Deleted

## 2016-06-13 NOTE — Telephone Encounter (Signed)
-----   Message from Marcial Pacas, MD sent at 06/12/2016  3:11 PM EDT ----- Please call patient for normal laboratory result

## 2016-06-13 NOTE — Telephone Encounter (Signed)
Called and spoke with patient about normal labs per Dr Krista Blue note. Pt verbalized understanding.

## 2016-07-09 ENCOUNTER — Telehealth: Payer: Self-pay | Admitting: Neurology

## 2016-07-09 NOTE — Telephone Encounter (Signed)
I called the patient back she stated that she wants to cancel the appointment for now and does not know when she will be able to reschedule.

## 2016-07-09 NOTE — Telephone Encounter (Signed)
Patient has appointment for an MRI this Wednesday and needs to reschedule.

## 2016-07-11 ENCOUNTER — Other Ambulatory Visit: Payer: 59

## 2016-08-01 ENCOUNTER — Ambulatory Visit: Payer: 59 | Admitting: Neurology

## 2016-09-04 ENCOUNTER — Ambulatory Visit: Payer: 59 | Admitting: Neurology

## 2016-09-18 IMAGING — CR DG CHEST 2V
2 series · 2 of 2 positions shown · non-contrast
Comparison: None.

CLINICAL DATA: Nasal congestion, chest pain, cough, nausea, smoking
history

EXAM:
CHEST  2 VIEW

[lateral]
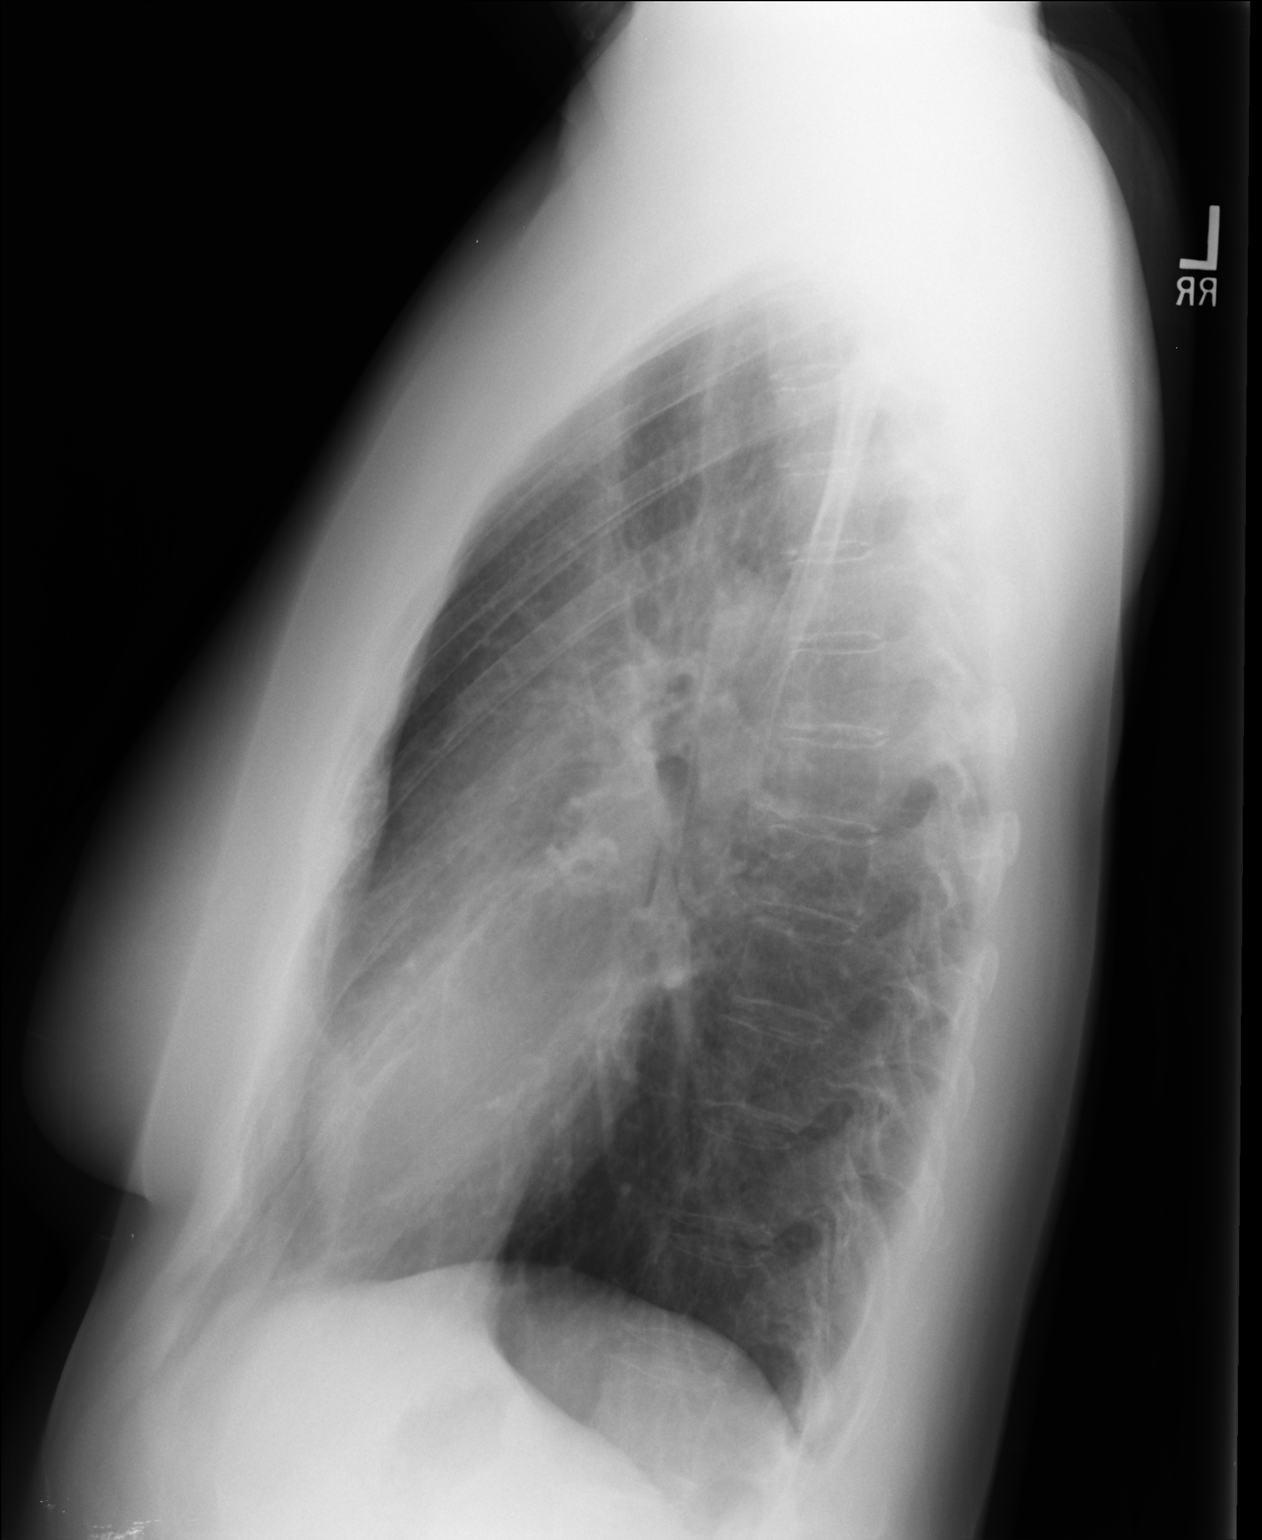

[PA]
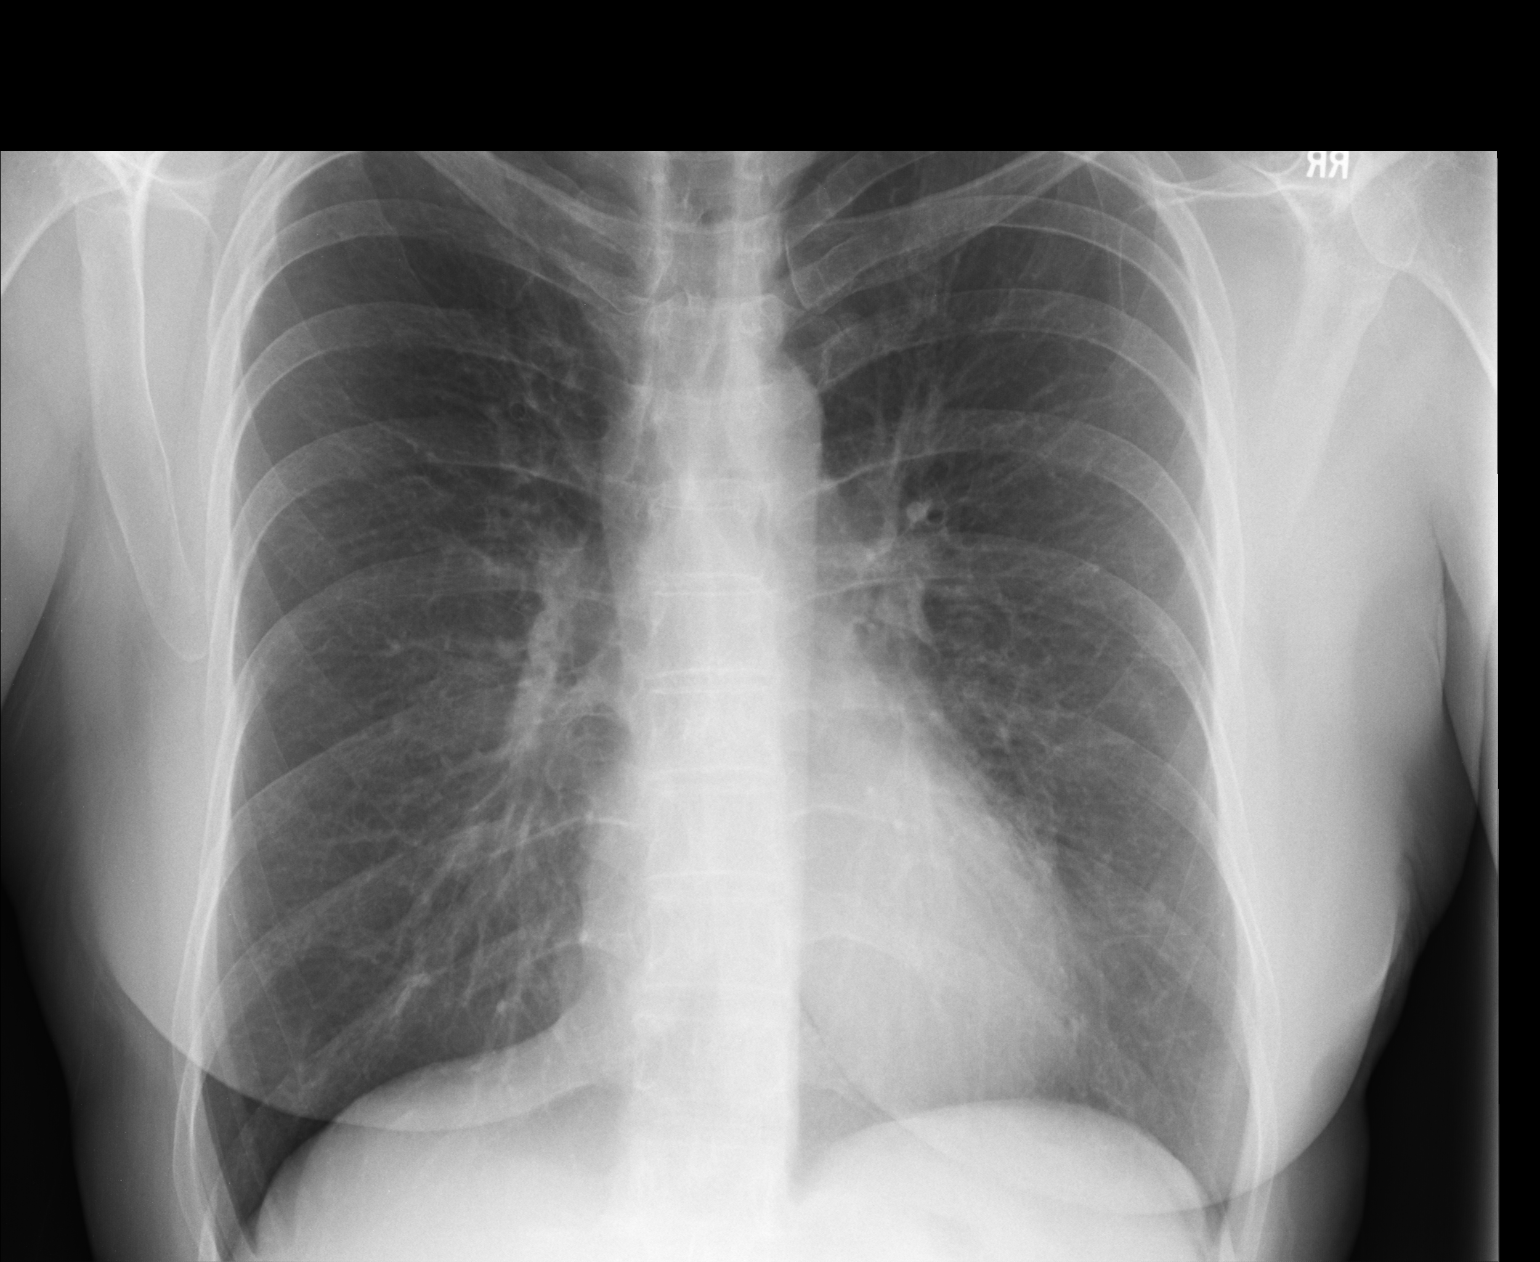

[2 of 2 positions shown; findings below may reference images not displayed]

FINDINGS: No active infiltrate or effusion is seen. There is slight prominence
of perihilar markings with peribronchial thickening which may
indicate bronchitis. Mediastinal and hilar contours otherwise are
unremarkable. The heart is within normal limits in size. No bony
abnormality is seen.
IMPRESSION: No active infiltrate or effusion. Peribronchial thickening may
indicate bronchitis.

## 2016-10-25 ENCOUNTER — Telehealth: Payer: Self-pay | Admitting: *Deleted

## 2016-10-25 NOTE — Telephone Encounter (Signed)
Jenna Chapman from Dr. Sherran Needs office called and referred the patient to [facility]. Patietn last seen in 2010. Patient referred for hormone therapy use estogen with past medical history

## 2016-12-03 ENCOUNTER — Telehealth: Payer: Self-pay | Admitting: *Deleted

## 2016-12-03 NOTE — Telephone Encounter (Signed)
Patient called and moved her appt from December 7th to January 4th

## 2016-12-07 ENCOUNTER — Ambulatory Visit: Payer: Self-pay | Admitting: Gynecology

## 2016-12-31 ENCOUNTER — Telehealth: Payer: Self-pay | Admitting: *Deleted

## 2016-12-31 NOTE — Telephone Encounter (Signed)
Patient called and left message to call her back at [phone]. Attempted to return call, unable to lave message

## 2017-01-04 ENCOUNTER — Ambulatory Visit: Payer: Self-pay | Admitting: Gynecology

## 2017-01-11 ENCOUNTER — Inpatient Hospital Stay: Payer: 59 | Attending: Gynecology | Admitting: Gynecology

## 2017-01-11 ENCOUNTER — Encounter: Payer: Self-pay | Admitting: Gynecology

## 2017-01-11 VITALS — BP 126/78 | HR 60 | Temp 97.7°F | Resp 18 | Ht 67.0 in | Wt 185.9 lb

## 2017-01-11 DIAGNOSIS — C539 Malignant neoplasm of cervix uteri, unspecified: Secondary | ICD-10-CM | POA: Diagnosis not present

## 2017-01-11 NOTE — Progress Notes (Signed)
Consult Note: Gyn-Onc   Jenna Chapman 47 y.o. female  Chief Complaint  Patient presents with  . 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  No evidence of recurrent disease.  Menopausal symptoms including hot flushes, night sweats, and vaginal dryness and dyspareunia.  Plan: I think it is perfectly reasonable to start the patient on hormone replacement therapy using estrogen vaginal therapy as well as systemic therapy.  The patient prefers to take pills rather than using the patch.  There is no indication that hormone replacement therapy would increase her risk of recurrent endocervical adenocarcinoma.  Potential benefits including improve quality of life and prevention or reduction of osteoporosis were reviewed with the patient.  All of her questions were answered.  She will return to Dr. Radene Knee for prescription of estrogen replacement therapy.   HPI: 47 year old white female seen in consultation request of Dr. Radene Knee regarding the advisability of prescribing estrogen replacement therapy.  Briefly, the patient underwent a radical trachelectomy and pelvic lymphadenectomy December 12, 2004 for an adenocarcinoma of the cervix.  She had good prognostic features and required no further therapy.  She has done well over the last 12 years.  The patient now has menopausal symptoms including significant hot flushes, night sweats, disrupted sleep, and vaginal dryness with associated dyspareunia.  The question is raised as to whether estrogen replacement therapy is safe in her current situation.  Patient has no past history of breast cancer, estrogen dependent tumors, or venous thromboembolic complications.  Review of Systems:10 point review of systems is negative except as noted in interval history.   Vitals: Blood pressure 126/78, pulse 60, temperature 97.7  F (36.5 C), resp. rate 18, height 5\' 7"  (1.702 m), weight 185 lb 14.4 oz (84.3 kg), SpO2 100 %.     No Known Allergies  Past Medical History:  Diagnosis Date  . HA (headache)     Past Surgical History:  Procedure Laterality Date  . ABDOMINAL HYSTERECTOMY      Current Outpatient Medications  Medication Sig Dispense Refill  . phentermine (ADIPEX-P) 37.5 MG tablet Take 37.5 mg by mouth daily before breakfast.     No current facility-administered medications for this visit.     Social History   Socioeconomic History  . Marital status: Married    Spouse name: Merry Proud  . Number of children: 2  . Years of education: 68  . Highest education level: Not on file  Social Needs  . Financial resource strain: Not on file  . Food insecurity - worry: Not on file  . Food insecurity - inability: Not on file  . Transportation needs - medical: Not on file  . Transportation needs - non-medical: Not on file  Occupational History  . 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 Smoking status: Former Smoker    Packs/day: 1.00    Years: 20.00    Pack years: 20.00    Types: Cigarettes  . Smokeless tobacco: Never Used  . Tobacco comment: Quit in May 2017  Substance and Sexual Activity  . Alcohol use: No    Alcohol/week: 0.0 oz  . Drug use: No  . Sexual activity: Not on file  Other Topics Concern  . Not on file  Social History Narrative   Patient lives at home with her husband Merry Proud). Patient  works [employer]. Patient has a 12th grade education.   Caffeine- 0ne cup of caffeine.   Right handed.   Patient has two children.    Family History  Problem Relation Age of Onset  . High Cholesterol Mother   . High blood pressure Mother   . Lung cancer Mother        06-15-14 diagnosed  . COPD Mother       Marti Sleigh, MD 01/11/2017, 10:11 AM

## 2017-09-01 IMAGING — DX DG WRIST COMPLETE 3+V*R*
4 series · 4 of 4 positions shown · non-contrast
Comparison: None.

CLINICAL DATA: Pain after fall.

EXAM:
RIGHT WRIST - COMPLETE 3+ VIEW

[x wrist pa right]
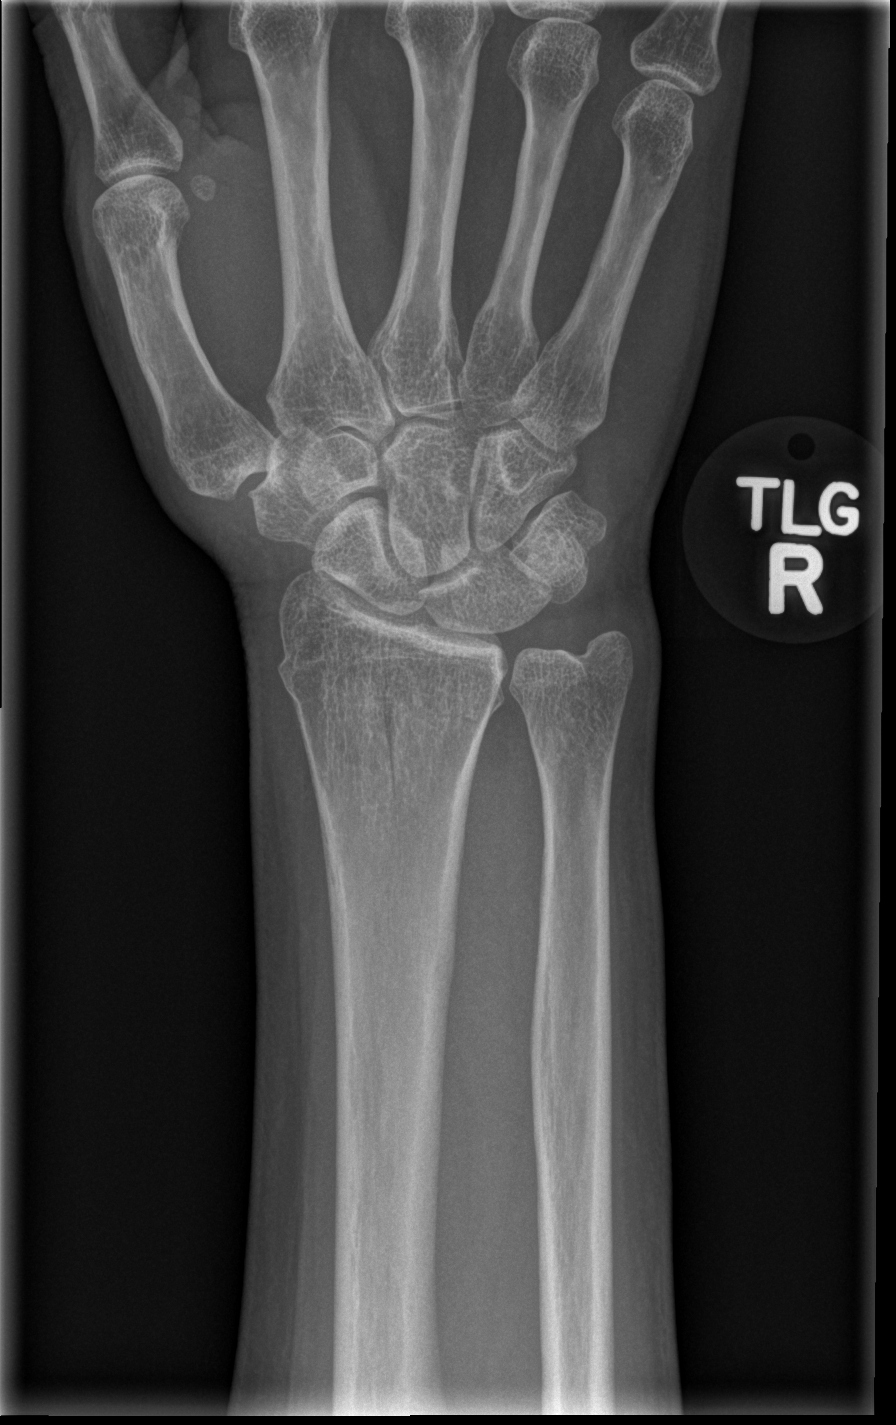

[x wrist obl right]
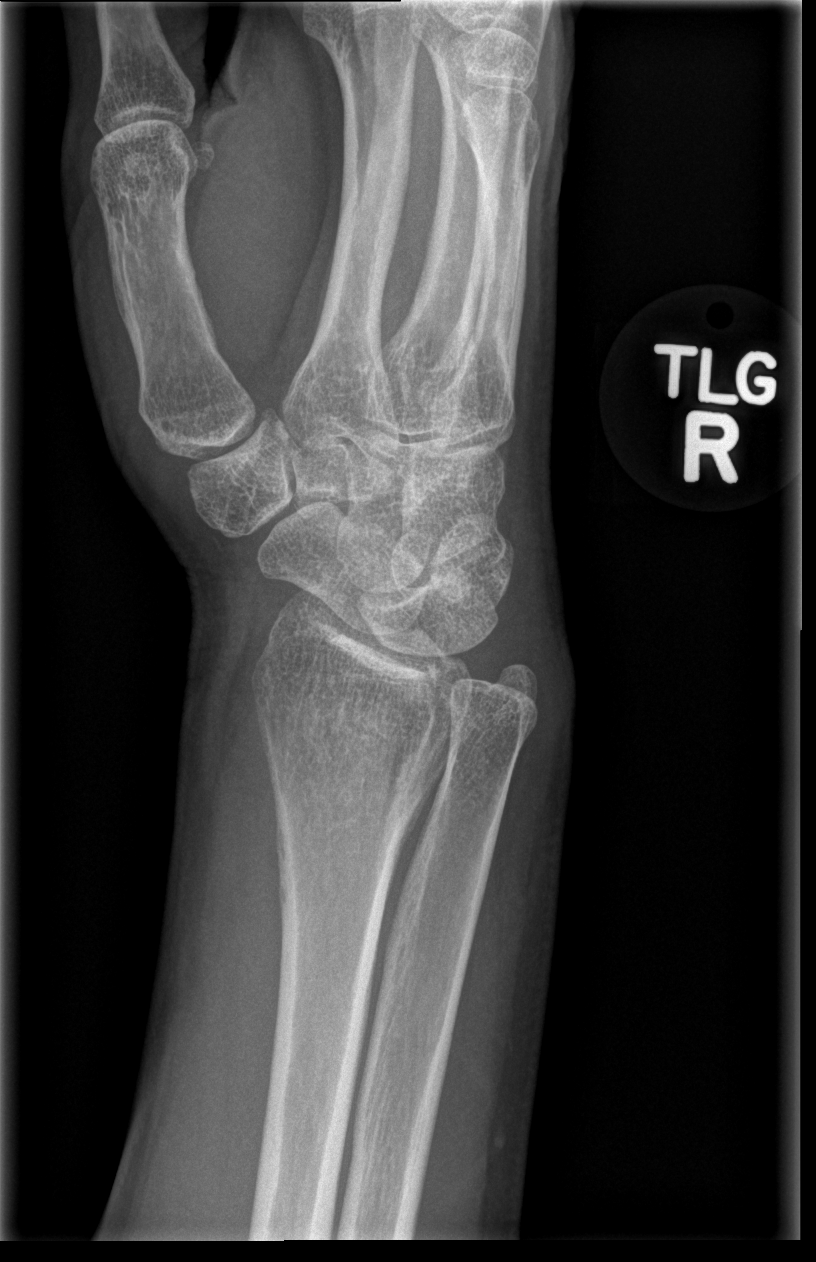

[x wrist lat right]
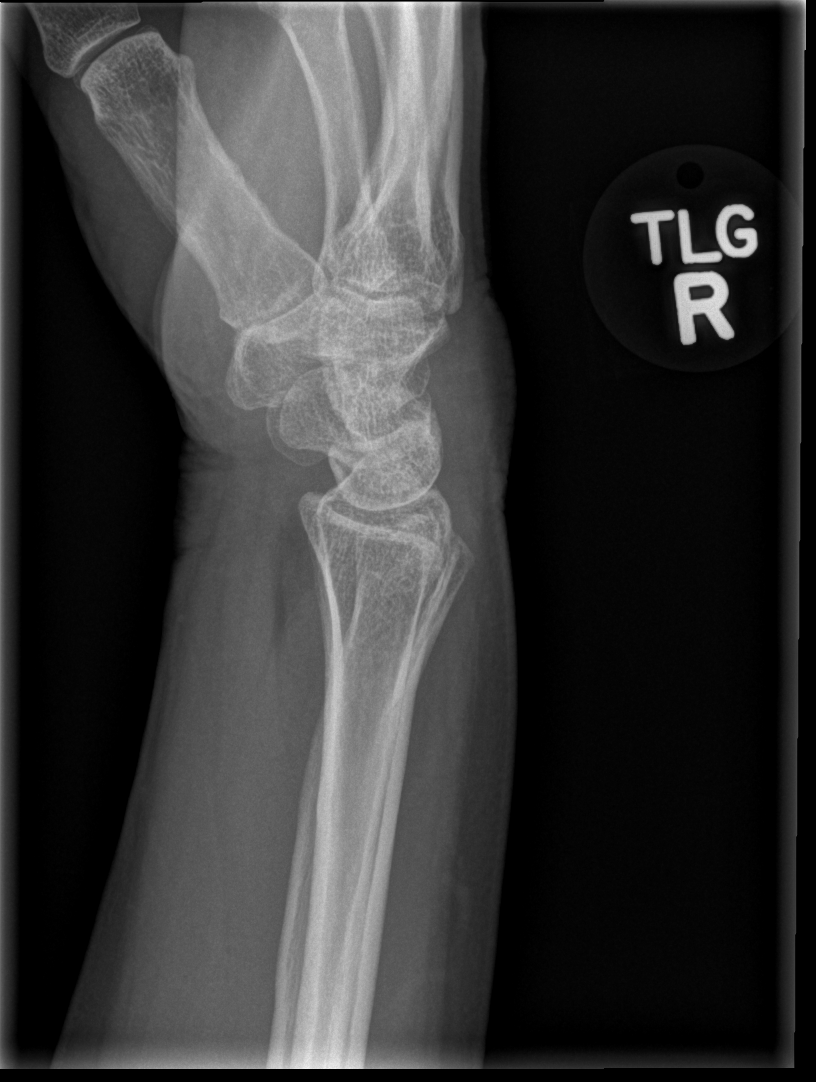

[x wrist navicular view right]
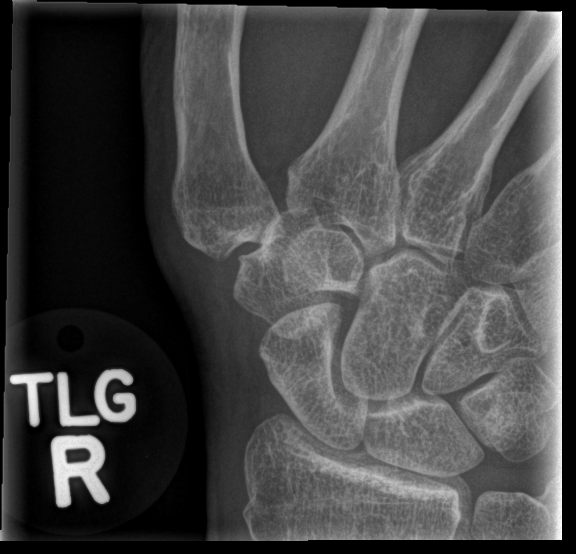

[4 of 4 positions shown; findings below may reference images not displayed]

FINDINGS: There is a nondisplaced fracture through the distal radial
metaphysis. The fracture extends into the radiocarpal joint. Soft
tissue swelling is seen.
IMPRESSION: Nondisplaced fracture through the distal radial metaphysis extending
into the radiocarpal joint.

## 2017-11-23 ENCOUNTER — Encounter: Payer: Self-pay | Admitting: Family Medicine

## 2017-11-23 ENCOUNTER — Other Ambulatory Visit: Payer: Self-pay

## 2017-11-23 ENCOUNTER — Ambulatory Visit: Payer: BLUE CROSS/BLUE SHIELD | Admitting: Family Medicine

## 2017-11-23 VITALS — BP 112/69 | HR 70 | Temp 97.9°F | Resp 18 | Ht 67.0 in | Wt 180.4 lb

## 2017-11-23 DIAGNOSIS — Z8669 Personal history of other diseases of the nervous system and sense organs: Secondary | ICD-10-CM | POA: Diagnosis not present

## 2017-11-23 DIAGNOSIS — H02842 Edema of right lower eyelid: Secondary | ICD-10-CM

## 2017-11-23 DIAGNOSIS — H5789 Other specified disorders of eye and adnexa: Secondary | ICD-10-CM | POA: Diagnosis not present

## 2017-11-23 NOTE — Patient Instructions (Addendum)
  Your eye exam appears reassuring today.  Okay to use over-the-counter artificial eyedrops such as Systane if needed for dry or irritated eyes.  If you are having more itching or possible allergy eye symptoms, Zaditor drops over the counter can help as well.  Return to the clinic or go to the nearest emergency room if any of your symptoms worsen or new symptoms occur.    If you have lab work done today you will be contacted with your lab results within the next 2 weeks.  If you have not heard from [place] then please contact us. The fastest way to get your results is to register for My Chart.   IF you received an x-ray today, you will receive an invoice from [facility]. Please contact [facility] at [phone] with questions or concerns regarding your invoice.   IF you received labwork today, you will receive an invoice from [place]. Please contact [facility] at [phone] with questions or concerns regarding your invoice.   Our billing staff will not be able to assist you with questions regarding bills from these companies.  You will be contacted with the lab results as soon as they are available. The fastest way to get your results is to activate your My Chart account. Instructions are located on the last page of this paperwork. If you have not heard from [place] regarding the results in 2 weeks, please contact this office.

## 2017-11-23 NOTE — Progress Notes (Signed)
Subjective:  By signing my name below, I, Jenna Chapman, attest that this documentation has been prepared under the direction and in the presence of Jenna Ray, MD. Electronically Signed: Moises Chapman, [place]. 11/23/2017 , 10:05 AM .  Patient was seen in Room 12 .   Patient ID: Jenna Chapman, female    DOB: 06/12/1970, 47 y.o.   MRN: [id number] Chief Complaint  Patient presents with  . Eye Problem    right eye is swollen and red since thursday no pain but itchy    HPI Jenna Chapman is a 47 y.o. female Here for right eye swelling and redness. Patient states she noticed swollen right lower lid when she woke up 2 days ago with eye redness and itching. She describes sensation of "like something is in her eye." She doesn't recall any injury, or working around Human resources officer, or being around dust. She purchased OTC eye drops that evening. Yesterday morning, the eye redness had improved but no longer itching and then, further improvement with minimal redness this morning. She denies congestion or rhinorrhea. She denies pink eye contact at home.    Visual Acuity Screening   Right eye Left eye Both eyes  Without correction:     With correction: 20/25 20/30 20/15    25 minutes visit with initial history, exam and eye stain/lid evaluation with fluorescin.   Patient Active Problem List   Diagnosis Date Noted  . Fall 06/07/2016  . Allergic rhinitis, seasonal 06/16/2014  . CA cervix ([place]) 06/16/2014  . Abnormal MRI of head 03/16/2014  . Migraine without aura and without status migrainosus, not intractable 02/09/2014  . Neck pain 11/19/2013  . Abnormal MRI 11/07/2012  . Subdural hematoma ([place]) 07/18/2012  . HA (headache)    Past Medical History:  Diagnosis Date  . HA (headache)    Past Surgical History:  Procedure Laterality Date  . ABDOMINAL HYSTERECTOMY     No Known Allergies Prior to Admission medications   Medication Sig Start Date End Date Taking? Authorizing  Provider  phentermine (ADIPEX-P) 37.5 MG tablet Take 37.5 mg by mouth daily before breakfast.    [provider]   Social History   Socioeconomic History  . Marital status: Married    Spouse name: Jenna Chapman  . Number of children: 2  . Years of education: 71  . Highest education level: Not on file  Occupational History  . Occupation: KEY Quarry manager: [employer]    Comment: [employer]  Social Needs  . Financial resource strain: Not on file  . Food insecurity:    Worry: Not on file    Inability: Not on file  . Transportation needs:    Medical: Not on file    Non-medical: Not on file  Tobacco Use  . Smoking status: Former Smoker    Packs/day: 1.00    Years: 20.00    Pack years: 20.00    Types: Cigarettes  . Smokeless tobacco: Never Used  . Tobacco comment: Quit in May 2017  Substance and Sexual Activity  . Alcohol use: No    Alcohol/week: 0.0 standard drinks  . Drug use: No  . Sexual activity: Not on file  Lifestyle  . Physical activity:    Days per week: Not on file    Minutes per session: Not on file  . Stress: Not on file  Relationships  . Social connections:    Talks on phone: Not on file    Gets together:  Not on file    Attends religious service: Not on file    Active member of club or organization: Not on file    Attends meetings of clubs or organizations: Not on file    Relationship status: Not on file  . Intimate partner violence:    Fear of current or ex partner: Not on file    Emotionally abused: Not on file    Physically abused: Not on file    Forced sexual activity: Not on file  Other Topics Concern  . Not on file  Social History Narrative   Patient lives at home with her husband Jenna Chapman). Patient works [employer]. Patient has a 12th grade education.   Caffeine- 0ne cup of caffeine.   Right handed.   Patient has two children.   Review of Systems  Constitutional: Negative for chills, fatigue, fever and unexpected weight change.  HENT:  Negative for congestion and rhinorrhea.   Eyes: Positive for redness and itching. Negative for pain.  Respiratory: Negative for cough.   Gastrointestinal: Negative for constipation, diarrhea, nausea and vomiting.  Skin: Negative for rash and wound.  Neurological: Negative for dizziness, weakness and headaches.       Objective:   Physical Exam  Constitutional: She is oriented to person, place, and time. She appears well-developed and well-nourished. No distress.  HENT:  Head: Normocephalic and atraumatic.  Eyes: Pupils are equal, round, and reactive to light. EOM are normal. Right conjunctiva is injected. Right eye exhibits no nystagmus. Left eye exhibits no nystagmus.  Right eye: anterior chamber is clear, minimal injection, no discharge at the canthi; no stye or lid swelling (10:10 AM) Proparacaine 2 drops into right eye: lid inverted, no foreign body seen; fluorescin stain applied in right eye, flushed with normal saline, no foreign body seen, no scratches   Neck: Neck supple.  Cardiovascular: Normal rate.  Pulmonary/Chest: Effort normal. No respiratory distress.  Musculoskeletal: Normal range of motion.  Lymphadenopathy:       Head (right side): No preauricular adenopathy present.       Head (left side): No preauricular adenopathy present.  Neurological: She is alert and oriented to person, place, and time.  Skin: Skin is warm and dry.  Psychiatric: She has a normal mood and affect. Her behavior is normal.  Nursing note and vitals reviewed.   Vitals:   11/23/17 0935  BP: 112/69  Pulse: 70  Resp: 18  Temp: 97.9 F (36.6 C)  TempSrc: Oral  SpO2: 99%  Weight: 180 lb 6.4 oz (81.8 kg)  Height: 5\' 7"  (1.702 m)       Assessment & Plan:  RAENAH MURLEY is a 47 y.o. female History of itching of eye  Red eye  Swelling of right lower eyelid  Possible initial abrasion with itching of eyes, no foreign body identified no apparent lid abnormality.  Reassuring exam as well as  visual acuity.  As symptoms have improved, continue symptomatic care with over-the-counter drops such as Systane if needed, or Zaditor for itching/possible allergy symptoms.  RTC precautions given  No orders of the defined types were placed in this encounter.  Patient Instructions    Your eye exam appears reassuring today.  Okay to use over-the-counter artificial eyedrops such as Systane if needed for dry or irritated eyes.  If you are having more itching or possible allergy eye symptoms, Zaditor drops over the counter can help as well.  Return to the clinic or go to the nearest emergency room if  any of your symptoms worsen or new symptoms occur.    If you have lab work done today you will be contacted with your lab results within the next 2 weeks.  If you have not heard from [place] then please contact us. The fastest way to get your results is to register for My Chart.   IF you received an x-Chapman today, you will receive an invoice from [facility]. Please contact [facility] at [phone] with questions or concerns regarding your invoice.   IF you received labwork today, you will receive an invoice from [place]. Please contact [facility] at [phone] with questions or concerns regarding your invoice.   Our billing staff will not be able to assist you with questions regarding bills from these companies.  You will be contacted with the lab results as soon as they are available. The fastest way to get your results is to activate your My Chart account. Instructions are located on the last page of this paperwork. If you have not heard from [place] regarding the results in 2 weeks, please contact this office.      I personally performed the services described in this documentation, which was scribed in my presence. The recorded information has been reviewed and considered for accuracy and completeness, addended by me as needed, and agree with information above.  Signed,   Jenna Ray, MD [facility] [place].  11/23/17 10:21 AM

## 2018-11-07 ENCOUNTER — Other Ambulatory Visit: Payer: Self-pay

## 2018-11-07 DIAGNOSIS — Z20822 Contact with and (suspected) exposure to covid-19: Secondary | ICD-10-CM

## 2018-11-09 LAB — NOVEL CORONAVIRUS, NAA: SARS-CoV-2, NAA: NOT DETECTED

## 2018-11-20 ENCOUNTER — Other Ambulatory Visit: Payer: Self-pay | Admitting: Obstetrics and Gynecology

## 2018-11-20 DIAGNOSIS — R928 Other abnormal and inconclusive findings on diagnostic imaging of breast: Secondary | ICD-10-CM

## 2018-11-26 ENCOUNTER — Other Ambulatory Visit: Payer: Self-pay | Admitting: Obstetrics and Gynecology

## 2018-11-26 ENCOUNTER — Ambulatory Visit
Admission: RE | Admit: 2018-11-26 | Discharge: 2018-11-26 | Disposition: A | Discharge status: Home / Self Care | Payer: BC Managed Care – PPO | Source: Ambulatory Visit | Attending: Obstetrics and Gynecology | Admitting: Obstetrics and Gynecology

## 2018-11-26 ENCOUNTER — Other Ambulatory Visit: Payer: Self-pay

## 2018-11-26 ENCOUNTER — Ambulatory Visit
Admission: RE | Admit: 2018-11-26 | Discharge: 2018-11-26 | Disposition: A | Discharge status: Home / Self Care | Payer: 59 | Source: Ambulatory Visit | Attending: Obstetrics and Gynecology | Admitting: Obstetrics and Gynecology

## 2018-11-26 DIAGNOSIS — R928 Other abnormal and inconclusive findings on diagnostic imaging of breast: Secondary | ICD-10-CM

## 2019-02-02 ENCOUNTER — Other Ambulatory Visit: Payer: Self-pay | Admitting: Obstetrics and Gynecology

## 2019-02-12 ENCOUNTER — Other Ambulatory Visit: Payer: Self-pay | Admitting: Obstetrics and Gynecology

## 2019-02-12 DIAGNOSIS — Z803 Family history of malignant neoplasm of breast: Secondary | ICD-10-CM

## 2019-03-03 ENCOUNTER — Ambulatory Visit: Payer: BC Managed Care – PPO

## 2019-03-06 ENCOUNTER — Ambulatory Visit: Payer: BC Managed Care – PPO

## 2019-06-02 ENCOUNTER — Ambulatory Visit
Admission: RE | Admit: 2019-06-02 | Discharge: 2019-06-02 | Disposition: A | Discharge status: Home / Self Care | Payer: BC Managed Care – PPO | Source: Ambulatory Visit | Attending: Obstetrics and Gynecology | Admitting: Obstetrics and Gynecology

## 2019-06-02 ENCOUNTER — Other Ambulatory Visit: Payer: Self-pay

## 2019-06-02 DIAGNOSIS — R928 Other abnormal and inconclusive findings on diagnostic imaging of breast: Secondary | ICD-10-CM

## 2020-04-18 ENCOUNTER — Other Ambulatory Visit: Payer: Self-pay | Admitting: Obstetrics and Gynecology

## 2020-04-18 DIAGNOSIS — R928 Other abnormal and inconclusive findings on diagnostic imaging of breast: Secondary | ICD-10-CM

## 2020-05-06 ENCOUNTER — Other Ambulatory Visit: Payer: Self-pay

## 2020-05-06 ENCOUNTER — Ambulatory Visit
Admission: RE | Admit: 2020-05-06 | Discharge: 2020-05-06 | Disposition: A | Discharge status: Home / Self Care | Payer: BC Managed Care – PPO | Source: Ambulatory Visit | Attending: Obstetrics and Gynecology | Admitting: Obstetrics and Gynecology

## 2020-05-06 ENCOUNTER — Ambulatory Visit: Payer: BC Managed Care – PPO

## 2020-05-06 DIAGNOSIS — R928 Other abnormal and inconclusive findings on diagnostic imaging of breast: Secondary | ICD-10-CM

## 2020-05-12 ENCOUNTER — Other Ambulatory Visit: Payer: BC Managed Care – PPO

## 2021-05-01 ENCOUNTER — Other Ambulatory Visit: Payer: Self-pay | Admitting: Obstetrics and Gynecology

## 2021-05-01 DIAGNOSIS — R928 Other abnormal and inconclusive findings on diagnostic imaging of breast: Secondary | ICD-10-CM

## 2021-05-11 ENCOUNTER — Other Ambulatory Visit: Payer: BC Managed Care – PPO

## 2021-06-08 ENCOUNTER — Ambulatory Visit
Admission: RE | Admit: 2021-06-08 | Discharge: 2021-06-08 | Disposition: A | Discharge status: Home / Self Care | Payer: BC Managed Care – PPO | Source: Ambulatory Visit | Attending: Obstetrics and Gynecology | Admitting: Obstetrics and Gynecology

## 2021-06-08 DIAGNOSIS — R928 Other abnormal and inconclusive findings on diagnostic imaging of breast: Secondary | ICD-10-CM

## 2022-07-20 ENCOUNTER — Other Ambulatory Visit: Payer: Self-pay | Admitting: Physician Assistant

## 2022-07-20 DIAGNOSIS — Z1231 Encounter for screening mammogram for malignant neoplasm of breast: Secondary | ICD-10-CM

## 2022-10-18 ENCOUNTER — Other Ambulatory Visit: Payer: Self-pay | Admitting: Obstetrics and Gynecology

## 2022-10-18 DIAGNOSIS — Z720 Tobacco use: Secondary | ICD-10-CM

## 2022-10-22 ENCOUNTER — Other Ambulatory Visit: Payer: Self-pay | Admitting: Obstetrics and Gynecology

## 2022-10-22 DIAGNOSIS — R928 Other abnormal and inconclusive findings on diagnostic imaging of breast: Secondary | ICD-10-CM

## 2022-10-24 ENCOUNTER — Encounter: Payer: Self-pay | Admitting: Obstetrics and Gynecology

## 2022-11-02 ENCOUNTER — Other Ambulatory Visit: Payer: Self-pay | Admitting: Obstetrics and Gynecology

## 2022-11-02 ENCOUNTER — Ambulatory Visit
Admission: RE | Admit: 2022-11-02 | Discharge: 2022-11-02 | Disposition: A | Discharge status: Home / Self Care | Payer: BC Managed Care – PPO | Source: Ambulatory Visit | Attending: Obstetrics and Gynecology | Admitting: Obstetrics and Gynecology

## 2022-11-02 ENCOUNTER — Ambulatory Visit
Admission: RE | Admit: 2022-11-02 | Discharge: 2022-11-02 | Disposition: A | Discharge status: Home / Self Care | Payer: No Typology Code available for payment source | Source: Ambulatory Visit | Attending: Obstetrics and Gynecology | Admitting: Obstetrics and Gynecology

## 2022-11-02 DIAGNOSIS — R928 Other abnormal and inconclusive findings on diagnostic imaging of breast: Secondary | ICD-10-CM

## 2022-11-02 DIAGNOSIS — N6489 Other specified disorders of breast: Secondary | ICD-10-CM

## 2022-11-08 ENCOUNTER — Other Ambulatory Visit: Payer: BC Managed Care – PPO

## 2022-11-08 ENCOUNTER — Ambulatory Visit
Admission: RE | Admit: 2022-11-08 | Discharge: 2022-11-08 | Disposition: A | Discharge status: Home / Self Care | Payer: No Typology Code available for payment source | Source: Ambulatory Visit | Attending: Obstetrics and Gynecology | Admitting: Obstetrics and Gynecology

## 2022-11-08 DIAGNOSIS — Z720 Tobacco use: Secondary | ICD-10-CM

## 2022-11-09 ENCOUNTER — Other Ambulatory Visit: Payer: Self-pay | Admitting: Medical Genetics

## 2022-11-09 DIAGNOSIS — Z006 Encounter for examination for normal comparison and control in clinical research program: Secondary | ICD-10-CM

## 2022-12-06 ENCOUNTER — Other Ambulatory Visit (HOSPITAL_COMMUNITY): Payer: Self-pay | Attending: Medical Genetics

## 2023-02-26 ENCOUNTER — Encounter: Payer: No Typology Code available for payment source | Admitting: Thoracic Surgery (Cardiothoracic Vascular Surgery)

## 2023-03-01 ENCOUNTER — Encounter: Payer: No Typology Code available for payment source | Admitting: Thoracic Surgery (Cardiothoracic Vascular Surgery)

## 2023-03-07 ENCOUNTER — Other Ambulatory Visit: Payer: Self-pay | Admitting: Thoracic Surgery (Cardiothoracic Vascular Surgery)

## 2023-03-07 ENCOUNTER — Encounter: Payer: Self-pay | Admitting: Thoracic Surgery (Cardiothoracic Vascular Surgery)

## 2023-03-07 ENCOUNTER — Institutional Professional Consult (permissible substitution): Payer: No Typology Code available for payment source | Admitting: Thoracic Surgery (Cardiothoracic Vascular Surgery)

## 2023-03-07 VITALS — BP 122/80 | HR 76 | Resp 20 | Wt 232.8 lb

## 2023-03-07 DIAGNOSIS — M898X8 Other specified disorders of bone, other site: Secondary | ICD-10-CM | POA: Diagnosis not present

## 2023-03-07 NOTE — Progress Notes (Signed)
 PCP is Arlan Organ, MD Referring Provider is Richardean Chimera, MD  Chief Complaint  Patient presents with   Bone lesion    CT chest 11/7    HPI: Jenna Chapman is sent for consultation regarding a sternal mass noted on a CT.  Jenna Chapman is a 53 year old woman with a history of tobacco use (20 pack years prior to quitting 2017) cervical cancer status post surgical resection, migraines, and a subdural hematoma.  She recently had a annual wellness visit.  Because of her smoking history she was referred for low-dose CT for lung cancer screening.  There was no suspicious pulmonary mass but there was an expansile lesion in the mid sternum.  She denies any pain in the sternal area.  Has occasional episodes of sharp chest pain on the left that typically last for less than a minute and resolve spontaneously.  No change in appetite or weight loss.  Feels tired all the time.  Husband thinks she may have sleep apnea.  Patient Active Problem List   Diagnosis Date Noted   Fall 06/07/2016   Allergic rhinitis, seasonal 06/16/2014   CA cervix ([facility]) 06/16/2014   Abnormal MRI of head 03/16/2014   Migraine without aura and without status migrainosus, not intractable 02/09/2014   Neck pain 11/19/2013   Abnormal MRI 11/07/2012   Subdural hematoma ([facility]) 07/18/2012   HA (headache)     Past Medical History:  Diagnosis Date   HA (headache)     Past Surgical History:  Procedure Laterality Date   ABDOMINAL HYSTERECTOMY      Family History  Problem Relation Age of Onset   High Cholesterol Mother    High blood pressure Mother    Lung cancer Mother        06-15-14 diagnosed   COPD Mother     Social History Social History   Tobacco Use   Smoking status: Former    Current packs/day: 1.00    Average packs/day: 1 pack/day for 20.0 years (20.0 ttl pk-yrs)    Types: Cigarettes   Smokeless tobacco: Never   Tobacco comments:    Quit in May 2017  Substance Use Topics   Alcohol use: No     Alcohol/week: 0.0 standard drinks of alcohol   Drug use: No    Current Outpatient Medications  Medication Sig Dispense Refill   citalopram (CELEXA) 20 MG tablet Take by mouth.     estradiol (ESTRACE) 1 MG tablet Take 1 mg by mouth daily.  11   No current facility-administered medications for this visit.    No Known Allergies  Review of Systems  Constitutional:  Positive for fatigue.  HENT:  Negative for trouble swallowing and voice change.   Respiratory:  Positive for apnea (Not formally diagnosed). Negative for cough, shortness of breath and wheezing.   Cardiovascular:  Negative for chest pain, palpitations and leg swelling.  Genitourinary:  Negative for difficulty urinating and dysuria.  Musculoskeletal:  Negative for arthralgias and myalgias.       Intermittent pain in the left anterior chest lasting less than a minute.  No sternal pain.  Hematological:  Negative for adenopathy. Does not bruise/bleed easily.  Psychiatric/Behavioral:  The patient is nervous/anxious.     BP 122/80 (BP Location: Left Arm, Patient Position: Sitting, Cuff Size: Large)   Pulse 76   Resp 20   Wt 232 lb 12.8 oz (105.6 kg)   SpO2 96% Comment: RA  BMI 36.46 kg/m  Physical Exam Vitals reviewed.  Constitutional:  General: She is not in acute distress. HENT:     Head: Normocephalic and atraumatic.  Eyes:     General: No scleral icterus.    Extraocular Movements: Extraocular movements intact.  Cardiovascular:     Rate and Rhythm: Normal rate and regular rhythm.     Heart sounds: Normal heart sounds. No murmur heard. Pulmonary:     Effort: Pulmonary effort is normal. No respiratory distress.     Breath sounds: Normal breath sounds. No wheezing or rales.  Abdominal:     General: There is no distension.     Palpations: Abdomen is soft.     Tenderness: There is no abdominal tenderness.  Musculoskeletal:     Comments: Mild discomfort with palpation mid sternum, no definite palpable mass   Lymphadenopathy:     Cervical: No cervical adenopathy.  Skin:    General: Skin is warm and dry.  Neurological:     General: No focal deficit present.     Mental Status: She is alert and oriented to person, place, and time.     Cranial Nerves: No cranial nerve deficit.     Motor: No weakness.     Diagnostic Tests: CT CHEST WITHOUT CONTRAST LOW-DOSE FOR LUNG CANCER SCREENING   TECHNIQUE: Multidetector CT imaging of the chest was performed following the standard protocol without IV contrast.   RADIATION DOSE REDUCTION: This exam was performed according to the departmental dose-optimization program which includes automated exposure control, adjustment of the mA and/or kV according to patient size and/or use of iterative reconstruction technique.   COMPARISON:  None Available.   FINDINGS: Cardiovascular: Heart size is normal. There is no significant pericardial fluid, thickening or pericardial calcification. Aortic atherosclerosis. No definite coronary artery calcifications.   Mediastinum/Nodes: No pathologically enlarged mediastinal or hilar lymph nodes. Please note that accurate exclusion of hilar adenopathy is limited on noncontrast CT scans. Esophagus is unremarkable in appearance. No axillary lymphadenopathy.   Lungs/Pleura: Small pleural-based pulmonary nodule in the periphery of the right upper lobe (axial image 57) with a volume derived mean diameter 5.7 mm. No other larger more suspicious appearing pulmonary nodules or masses are noted. No acute consolidative airspace disease. No pleural effusions. Mild diffuse bronchial wall thickening with mild centrilobular and paraseptal emphysema. Scattered areas of cylindrical bronchiectasis with some thickening of the peribronchovascular interstitium and regional architectural distortion, most evident in the right middle lobe and inferior segment of the lingula.   Upper Abdomen: Tiny calcified gallstones noted in the fundus  of the gallbladder.   Musculoskeletal: In the sternum there is an expansile lesion with mixed lucency and sclerosis, overall with a chondroid appearance which is estimated to measure approximately 1.9 x 3.1 x 3.6 cm.   IMPRESSION: 1. Lung-RADS 2S, benign appearance or behavior. Continue annual screening with low-dose chest CT without contrast in 12 months. 2. The "S" modifier above refers to potentially clinically significant non lung cancer related findings. Specifically, there is a chondroid appearing lesion in the sternum which is expansile in appearance, but otherwise well-defined without definite bony destruction or soft tissue component. Differential considerations include both benign and malignant chondroid lesions. Clinical correlation for history of pain at the site, which could indicate an aggressive lesion such as a chondrosarcoma. In the absence of such history, follow-up noncontrast chest CT is recommended in 3-6 months to ensure the stability in size and appearance of this lesion. 3. Mild diffuse bronchial wall thickening with mild centrilobular and paraseptal emphysema; imaging findings suggestive of underlying COPD.  4. Imaging findings in the lungs which could suggest a chronic indolent atypical infectious process such as MAI (mycobacterium avium intracellulare). Outpatient referral to Pulmonology for further clinical evaluation is recommended.   Aortic Atherosclerosis (ICD10-I70.0) and Emphysema (ICD10-J43.9).     Electronically Signed   By: Trudie Reed M.D.   On: 11/27/2022 14:18 I personally reviewed the CT images and also reviewed that with Dr. Elby Showers from interventional radiology.  1.9 x 3.1 x 3.6 cm expansile lesion mid sternum with both lucent and sclerotic areas.  Impression: Jenna Chapman is a 53 year old woman with a history of tobacco use (20 pack years prior to quitting 2017) cervical cancer status post surgical resection, migraines, subdural  hematoma, and a mass in the sternum.  Sternal mass-1.9 x 3.1 x 3.6 cm expansile lesion with both lucent and sclerotic areas.  Chondroid in appearance per radiology.  Differential diagnosis includes chondroma and chondrosarcoma.  Reviewed the images in person with Dr. Elby Showers from radiology.  He leans towards this being a benign lesion, but cannot rule out the possibility of malignancy definitely.  I recommended to Jenna Chapman that we do a CT-guided needle biopsy to try to establish a diagnosis.  If we can determine whether this is benign or malignant we can plan for observation or resection.  Would be reasonable to resect whether benign or malignant, but we would need to know that going in so that we can have an appropriate plan for reconstruction, which would be much more extensive should this be a sarcoma.   Plan: CT-guided needle biopsy of sternal mass Return after biopsy to discuss observation versus surgical resection.  I spent over 30 minutes in review of records, images, and in consultation with Jenna Chapman today. Loreli Slot, MD [facility] [phone]

## 2023-03-08 ENCOUNTER — Encounter: Payer: Self-pay | Admitting: *Deleted

## 2023-03-08 NOTE — Progress Notes (Unsigned)
 Jenna Big, MD  Marylynn Pearson Approved for CT guided bx of sternal mass.    HKM

## 2023-03-13 ENCOUNTER — Encounter (HOSPITAL_BASED_OUTPATIENT_CLINIC_OR_DEPARTMENT_OTHER): Payer: Self-pay

## 2023-03-18 ENCOUNTER — Other Ambulatory Visit (HOSPITAL_COMMUNITY): Payer: Self-pay | Admitting: Student

## 2023-03-18 DIAGNOSIS — M898X8 Other specified disorders of bone, other site: Secondary | ICD-10-CM

## 2023-03-21 NOTE — H&P (Signed)
 Chief Complaint: Sternal mass  Referring Provider(s): Dorris Fetch  Supervising Physician: Ruel Favors  Patient Status: [facility] - Out-pt  History of Present Illness: Jenna Chapman is a 53 y.o. female with history of tobacco use (20 pack years prior to quitting 2017), cervical cancer status post surgical resection, migraines, and a subdural hematoma.   Due to her smoking history she underwent CT for lung cancer screening.   There was no suspicious pulmonary mass but there was an expansile lesion in the mid sternum.  She is here today for biopsy.   She reports some fatigue. She denies any pain in the sternal area.  No change in appetite or weight loss.  No nausea/vomiting. No Fever/chills. ROS negative.   Patient is Full Code  Past Medical History:  Diagnosis Date   HA (headache)     Past Surgical History:  Procedure Laterality Date   ABDOMINAL HYSTERECTOMY      Allergies: Patient has no known allergies.  Medications: Prior to Admission medications   Medication Sig Start Date End Date Taking? Authorizing Provider  citalopram (CELEXA) 20 MG tablet Take by mouth. 01/31/23   [provider]  estradiol (ESTRACE) 1 MG tablet Take 1 mg by mouth daily. 11/09/17   [provider]     Family History  Problem Relation Age of Onset   High Cholesterol Mother    High blood pressure Mother    Lung cancer Mother        06-15-14 diagnosed   COPD Mother     Social History   Socioeconomic History   Marital status: Married    Spouse name: Trey Paula   Number of children: 2   Years of education: 12   Highest education level: Not on file  Occupational History   Occupation: KEY Set designer: [employer]    Comment: [employer]  Tobacco Use   Smoking status: Former    Current packs/day: 1.00    Average packs/day: 1 pack/day for 20.0 years (20.0 ttl pk-yrs)    Types: Cigarettes   Smokeless tobacco: Never   Tobacco comments:    Quit in May 2017   Substance and Sexual Activity   Alcohol use: No    Alcohol/week: 0.0 standard drinks of alcohol   Drug use: No   Sexual activity: Not on file  Other Topics Concern   Not on file  Social History Narrative   Patient lives at home with her husband Trey Paula). Patient works [employer]. Patient has a 12th grade education.   Caffeine- 0ne cup of caffeine.   Right handed.   Patient has two children.   Social Drivers of Corporate investment banker Strain: Not on file  Food Insecurity: Not on file  Transportation Needs: Not on file  Physical Activity: Not on file  Stress: Not on file  Social Connections: Unknown (05/13/2021)   Received from [facility]   Social Network    Social Network: Not on file     Review of Systems: A 12 point ROS discussed and pertinent positives are indicated in the HPI above.  All other systems are negative.  Review of Systems  Vital Signs: There were no vitals taken for this visit.  Advance Care Plan: The advanced care place/surrogate decision maker was discussed at the time of visit and the patient did not wish to discuss or was not able to name a surrogate decision maker or provide an advance care plan.  Physical  Exam Vitals reviewed.  Constitutional:      Appearance: Normal appearance.  HENT:     Head: Normocephalic and atraumatic.  Eyes:     Extraocular Movements: Extraocular movements intact.  Cardiovascular:     Rate and Rhythm: Normal rate and regular rhythm.  Pulmonary:     Effort: Pulmonary effort is normal. No respiratory distress.     Breath sounds: Normal breath sounds.  Abdominal:     Palpations: Abdomen is soft.  Musculoskeletal:        General: Normal range of motion.     Cervical back: Normal range of motion.  Skin:    General: Skin is warm and dry.  Neurological:     General: No focal deficit present.     Mental Status: She is alert and oriented to person, place, and time.  Psychiatric:        Mood and Affect:  Mood normal.        Behavior: Behavior normal.        Thought Content: Thought content normal.        Judgment: Judgment normal.     Imaging: CLINICAL DATA:  53 year old former smoker with 64 pack-year history of smoking. Lung cancer screening examination.   EXAM: CT CHEST WITHOUT CONTRAST LOW-DOSE FOR LUNG CANCER SCREENING   TECHNIQUE: Multidetector CT imaging of the chest was performed following the standard protocol without IV contrast.   RADIATION DOSE REDUCTION: This exam was performed according to the departmental dose-optimization program which includes automated exposure control, adjustment of the mA and/or kV according to patient size and/or use of iterative reconstruction technique.   COMPARISON:  None Available.   FINDINGS: Cardiovascular: Heart size is normal. There is no significant pericardial fluid, thickening or pericardial calcification. Aortic atherosclerosis. No definite coronary artery calcifications.   Mediastinum/Nodes: No pathologically enlarged mediastinal or hilar lymph nodes. Please note that accurate exclusion of hilar adenopathy is limited on noncontrast CT scans. Esophagus is unremarkable in appearance. No axillary lymphadenopathy.   Lungs/Pleura: Small pleural-based pulmonary nodule in the periphery of the right upper lobe (axial image 57) with a volume derived mean diameter 5.7 mm. No other larger more suspicious appearing pulmonary nodules or masses are noted. No acute consolidative airspace disease. No pleural effusions. Mild diffuse bronchial wall thickening with mild centrilobular and paraseptal emphysema. Scattered areas of cylindrical bronchiectasis with some thickening of the peribronchovascular interstitium and regional architectural distortion, most evident in the right middle lobe and inferior segment of the lingula.   Upper Abdomen: Tiny calcified gallstones noted in the fundus of the gallbladder.   Musculoskeletal: In the  sternum there is an expansile lesion with mixed lucency and sclerosis, overall with a chondroid appearance which is estimated to measure approximately 1.9 x 3.1 x 3.6 cm.   IMPRESSION: 1. Lung-RADS 2S, benign appearance or behavior. Continue annual screening with low-dose chest CT without contrast in 12 months. 2. The "S" modifier above refers to potentially clinically significant non lung cancer related findings. Specifically, there is a chondroid appearing lesion in the sternum which is expansile in appearance, but otherwise well-defined without definite bony destruction or soft tissue component. Differential considerations include both benign and malignant chondroid lesions. Clinical correlation for history of pain at the site, which could indicate an aggressive lesion such as a chondrosarcoma. In the absence of such history, follow-up noncontrast chest CT is recommended in 3-6 months to ensure the stability in size and appearance of this lesion. 3. Mild diffuse bronchial wall thickening with mild  centrilobular and paraseptal emphysema; imaging findings suggestive of underlying COPD. 4. Imaging findings in the lungs which could suggest a chronic indolent atypical infectious process such as MAI (mycobacterium avium intracellulare). Outpatient referral to Pulmonology for further clinical evaluation is recommended.   Aortic Atherosclerosis (ICD10-I70.0) and Emphysema (ICD10-J43.9).     Electronically Signed   By: Trudie Reed M.D.   On: 11/27/2022 14:18    Labs:  CBC: No results for input(s): "WBC", "HGB", "HCT", "PLT" in the last 8760 hours.  COAGS: No results for input(s): "INR", "APTT" in the last 8760 hours.  BMP: No results for input(s): "NA", "K", "CL", "CO2", "GLUCOSE", "BUN", "CALCIUM", "CREATININE", "GFRNONAA", "GFRAA" in the last 8760 hours.  Invalid input(s): "CMP"  LIVER FUNCTION TESTS: No results for input(s): "BILITOT", "AST", "ALT", "ALKPHOS", "PROT",  "ALBUMIN" in the last 8760 hours.  TUMOR MARKERS: No results for input(s): "AFPTM", "CEA", "CA199", "CHROMGRNA" in the last 8760 hours.  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  Will proceed with image guided biopsy today by Dr. Miles Costain.  Risks and benefits of sternal biopsy was discussed with the patient and/or patient's family including, but not limited to bleeding, infection, damage to adjacent structures or low yield requiring additional tests.  All of the questions were answered and there is agreement to proceed.  Consent signed and in chart.   Thank you for allowing our service to participate in Jenna Chapman 's care.  Electronically Signed: Gwynneth Macleod, PA-C   03/21/2023, 3:18 PM      I spent a total of  30 Minutes  in face to face in clinical consultation, greater than 50% of which was counseling/coordinating care for sternal biopsy.  (A copy of this note was sent to the referring provider and the time of visit.)

## 2023-03-22 ENCOUNTER — Other Ambulatory Visit: Payer: Self-pay

## 2023-03-22 ENCOUNTER — Ambulatory Visit (HOSPITAL_COMMUNITY)
Admission: RE | Admit: 2023-03-22 | Discharge: 2023-03-22 | Disposition: A | Discharge status: Home / Self Care | Source: Ambulatory Visit | Attending: Thoracic Surgery (Cardiothoracic Vascular Surgery) | Admitting: Thoracic Surgery (Cardiothoracic Vascular Surgery)

## 2023-03-22 ENCOUNTER — Other Ambulatory Visit (HOSPITAL_COMMUNITY): Admit: 2023-03-22

## 2023-03-22 DIAGNOSIS — M898X8 Other specified disorders of bone, other site: Secondary | ICD-10-CM | POA: Diagnosis present

## 2023-03-22 DIAGNOSIS — Z90710 Acquired absence of both cervix and uterus: Secondary | ICD-10-CM | POA: Diagnosis not present

## 2023-03-22 DIAGNOSIS — Z8541 Personal history of malignant neoplasm of cervix uteri: Secondary | ICD-10-CM | POA: Insufficient documentation

## 2023-03-22 DIAGNOSIS — D169 Benign neoplasm of bone and articular cartilage, unspecified: Secondary | ICD-10-CM | POA: Insufficient documentation

## 2023-03-22 DIAGNOSIS — Z87891 Personal history of nicotine dependence: Secondary | ICD-10-CM | POA: Insufficient documentation

## 2023-03-22 LAB — CBC
HCT: 37.7 % (ref 36.0–46.0)
Hemoglobin: 12.4 g/dL (ref 12.0–15.0)
MCH: 30.5 pg (ref 26.0–34.0)
MCHC: 32.9 g/dL (ref 30.0–36.0)
MCV: 92.9 fL (ref 80.0–100.0)
Platelets: 174 10*3/uL (ref 150–400)
RBC: 4.06 MIL/uL (ref 3.87–5.11)
RDW: 12.7 % (ref 11.5–15.5)
WBC: 6.7 10*3/uL (ref 4.0–10.5)
nRBC: 0 % (ref 0.0–0.2)

## 2023-03-22 LAB — PROTIME-INR
INR: 1 (ref 0.8–1.2)
Prothrombin Time: 13.5 s (ref 11.4–15.2)

## 2023-03-22 MED ORDER — MIDAZOLAM HCL 2 MG/2ML IJ SOLN
INTRAMUSCULAR | Status: AC
  Filled 2023-03-22: qty 2

## 2023-03-22 MED ORDER — FENTANYL CITRATE (PF) 100 MCG/2ML IJ SOLN
INTRAMUSCULAR | Status: AC
  Filled 2023-03-22: qty 2

## 2023-03-22 MED ORDER — MIDAZOLAM HCL 2 MG/2ML IJ SOLN
INTRAMUSCULAR | Status: AC | PRN
  Administered 2023-03-22 (×2): .5 mg via INTRAVENOUS
  Administered 2023-03-22: 1 mg via INTRAVENOUS

## 2023-03-22 MED ORDER — LIDOCAINE HCL (PF) 1 % IJ SOLN
20.0000 mL | Freq: Once | INTRAMUSCULAR | Status: AC
  Administered 2023-03-22: 20 mL
  Filled 2023-03-22: qty 20

## 2023-03-22 MED ORDER — FENTANYL CITRATE (PF) 100 MCG/2ML IJ SOLN
INTRAMUSCULAR | Status: AC | PRN
  Administered 2023-03-22: 25 ug via INTRAVENOUS
  Administered 2023-03-22: 50 ug via INTRAVENOUS
  Administered 2023-03-22: 25 ug via INTRAVENOUS

## 2023-03-22 NOTE — Procedures (Signed)
  Procedure:  CT core sternal bone lesion   Preprocedure diagnosis: The encounter diagnosis was Sternal mass. Postprocedure diagnosis: same EBL:    minimal Complications:   none immediate  See full dictation in [employer].  Thora Lance MD Main # [phone] Pager  [phone] Mobile [phone]

## 2023-03-27 LAB — SURGICAL PATHOLOGY

## 2023-04-02 ENCOUNTER — Encounter: Payer: Self-pay | Admitting: Thoracic Surgery (Cardiothoracic Vascular Surgery)

## 2023-04-02 ENCOUNTER — Encounter (INDEPENDENT_AMBULATORY_CARE_PROVIDER_SITE_OTHER): Payer: BLUE CROSS/BLUE SHIELD | Admitting: Internal Medicine

## 2023-04-02 ENCOUNTER — Ambulatory Visit: Admitting: Thoracic Surgery (Cardiothoracic Vascular Surgery)

## 2023-04-02 VITALS — BP 139/85 | HR 74 | Resp 18 | Ht 67.0 in | Wt 235.0 lb

## 2023-04-02 DIAGNOSIS — Z9889 Other specified postprocedural states: Secondary | ICD-10-CM

## 2023-04-02 DIAGNOSIS — M898X8 Other specified disorders of bone, other site: Secondary | ICD-10-CM | POA: Diagnosis not present

## 2023-04-02 HISTORY — DX: Other specified disorders of bone, other site: M89.8X8

## 2023-04-02 NOTE — Progress Notes (Signed)
      [address]       [place] [address]             [phone]     HPI: [name] returns for follow-up after biopsy of a sternal mass  Jenna Chapman is a 53 year old woman with a history of tobacco use, cervical cancer, migraines, and a subdural hematoma.  She recently had a low-dose CT for lung cancer screening because of her smoking history.  There were no suspicious pulmonary nodules, but there was an expansile lesion in the mid sternum.  She was not having any significant pain in that area.  She underwent a needle biopsy.  Patient Active Problem List   Diagnosis Date Noted   Sternal mass 04/02/2023   Fall 06/07/2016   Allergic rhinitis, seasonal 06/16/2014   CA cervix (HCC) 06/16/2014   Abnormal MRI of head 03/16/2014   Migraine without aura and without status migrainosus, not intractable 02/09/2014   Neck pain 11/19/2013   Abnormal MRI 11/07/2012   Subdural hematoma (HCC) 07/18/2012   HA (headache)      Current Outpatient Medications  Medication Sig Dispense Refill   citalopram (CELEXA) 20 MG tablet Take by mouth.     estradiol (ESTRACE) 1 MG tablet Take 1 mg by mouth daily.  11   No current facility-administered medications for this visit.    Physical Exam BP 139/85 (BP Location: Left Arm)   Pulse 74   Resp 18   Ht 5\' 7"  (1.702 m)   Wt 235 lb (106.6 kg)   SpO2 97%   BMI 36.81 kg/m  Obese 53 year old woman in no acute distress  Diagnostic Tests: A. BONE, STERNAL, BIOPSY:  - Benign chondroid proliferation with ossification and mild atypia, see  comment   COMMENT:   Findings appear most consistent with a benign process such as an  intramedullary osteochondroma.  Dr. Venetia Chapman reviewed the case and  concurs with the diagnosis.   Impression: Jenna Chapman is a 53 year old woman with a history of tobacco use, cervical cancer, migraines, subdural hematoma, and a sternal mass.   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  We discussed the options of surgical resection versus radiographic observation.  She wishes to follow this radiographically.  Although the biopsy is reassuring, I think we need to continue to follow the mass.  Would plan a CT in 3 months and then at 6 months and then if it remains stable can probably follow annually after that.  Once we get to that point can be done in conjunction with her low-dose CT for lung cancer screening.  Plan: Return in 3 months with CT chest  Loreli Slot, MD [facility] [phone]

## 2023-04-30 ENCOUNTER — Ambulatory Visit (INDEPENDENT_AMBULATORY_CARE_PROVIDER_SITE_OTHER): Admitting: Internal Medicine

## 2023-04-30 ENCOUNTER — Encounter (INDEPENDENT_AMBULATORY_CARE_PROVIDER_SITE_OTHER): Payer: Self-pay | Admitting: Internal Medicine

## 2023-04-30 VITALS — BP 120/77 | HR 78 | Temp 98.2°F | Ht 67.0 in | Wt 235.0 lb

## 2023-04-30 DIAGNOSIS — R638 Other symptoms and signs concerning food and fluid intake: Secondary | ICD-10-CM | POA: Diagnosis not present

## 2023-04-30 DIAGNOSIS — M25561 Pain in right knee: Secondary | ICD-10-CM

## 2023-04-30 DIAGNOSIS — J432 Centrilobular emphysema: Secondary | ICD-10-CM | POA: Insufficient documentation

## 2023-04-30 DIAGNOSIS — G8929 Other chronic pain: Secondary | ICD-10-CM | POA: Diagnosis not present

## 2023-04-30 DIAGNOSIS — M25562 Pain in left knee: Secondary | ICD-10-CM | POA: Diagnosis not present

## 2023-04-30 DIAGNOSIS — Z0289 Encounter for other administrative examinations: Secondary | ICD-10-CM

## 2023-04-30 DIAGNOSIS — Z6834 Body mass index (BMI) 34.0-34.9, adult: Secondary | ICD-10-CM | POA: Insufficient documentation

## 2023-04-30 DIAGNOSIS — E66812 Obesity, class 2: Secondary | ICD-10-CM | POA: Insufficient documentation

## 2023-04-30 DIAGNOSIS — Z6836 Body mass index (BMI) 36.0-36.9, adult: Secondary | ICD-10-CM

## 2023-04-30 NOTE — Assessment & Plan Note (Addendum)
 Found on CT scan of the chest for lung cancer screening.  I reviewed findings with patient she also has a degree of bronchiectasis.  She has quit smoking and was congratulated on that.  She does acknowledge having dyspnea on exertion she may benefit from spirometry to determine if she has a degree of COPD.  I will defer further evaluation to primary care team.  Losing 10% of body weight may improve her breathing.  Patient will also be screened for sleep apnea at her intake appointment.

## 2023-04-30 NOTE — Progress Notes (Signed)
 Office: [phone]  /  Fax: [phone]   Initial Visit  Jenna Chapman was seen in clinic today to evaluate for obesity. She is interested in losing weight to improve overall health and reduce the risk of weight related complications. She presents today to review program treatment options, initial physical assessment, and evaluation.     Discussed the use of AI scribe software for clinical note transcription with the patient, who gave verbal consent to proceed.   History of Present Illness   Jenna Chapman is a 53 year old female who presents for an introductory visit to medical weight management.  She has previously used phentermine and Ozempic for weight management, experiencing initial weight loss followed by regain after discontinuation. Significant weight gain occurred after a hysterectomy with oophorectomy at age 68, increasing from size 7 to size 20. She attributes weight gain to hormonal changes and stress eating, exacerbated by work stress.  Joint pain in her back and knees and increased shortness of breath with activity are present. She quit smoking 13 years ago. A CT scan last year showed mild emphysema and mucus plugging from bronchiectasis.  Current medications include Celexa and estradiol, with no noted weight gain. She experiences high stress and poor sleep, contributing to her weight issues. Physical activity is low, and she has a strong desire to eat frequently, especially when stressed.  Family history includes obesity in her sister. She has no personal history of hypertension, hyperlipidemia, or prediabetes. She has two children with no significant weight gain during pregnancies.         She was referred by: PCP  When asked what else they would like to accomplish? She states: Adopt healthier eating patterns, Improve energy levels and physical activity, and Improve quality of life  When asked how has your weight affected you? She states: Has affected  self-esteem, Contributed to orthopedic problems or mobility issues, Having fatigue, Having poor endurance, and Has affected mood   Some associated conditions: Arthritis:back and knees, DOE  Contributing factors: Family history of obesity, Disruption of circadian rhythm / sleep disordered breathing, Consumption of processed foods, Moderate to high levels of stress, Reduced physical activity, Eating patterns, Menopause, and Strong orexigenic signaling and inadequate inhibitory control   Weight promoting medications identified: None  Current nutrition plan: None  Current level of physical activity: Low levels of physical activity at present  Current or previous pharmacotherapy: Phentermine for 12 months, lost weight and then plateaued. Tried ozempic for < 12 months and lost and regained.  Response to medication: Lost weight initially but was unable to sustain weight loss   Past medical history includes:   Past Medical History:  Diagnosis Date   HA (headache)    Sternal mass 04/02/2023     Objective:   BP 120/77   Pulse 78   Temp 98.2 F (36.8 C)   Ht 5\' 7"  (1.702 m)   Wt 235 lb (106.6 kg)   SpO2 97%   BMI 36.81 kg/m  She was weighed on the bioimpedance scale: Body mass index is 36.81 kg/m.  Peak Weight: 237 , Body Fat%:47, Visceral Fat Rating:13, Weight trend over the last 12 months: Increasing  General:  Alert, oriented and cooperative. Patient is in no acute distress.  Respiratory: Normal respiratory effort, no problems with respiration noted   Gait: able to ambulate independently  Mental Status: Normal mood and affect. Normal behavior. Normal judgment and thought content.   DIAGNOSTIC DATA REVIEWED:  BMET    Component  Value Date/Time   NA 143 06/07/2016 0817   K 4.3 06/07/2016 0817   CL 104 06/07/2016 0817   CO2 25 06/07/2016 0817   GLUCOSE 91 06/07/2016 0817   GLUCOSE 90 06/24/2014 1928   BUN 16 06/07/2016 0817   CREATININE 0.80 06/07/2016 0817   CREATININE  0.84 06/24/2014 1928   CALCIUM 9.3 06/07/2016 0817   GFRNONAA 89 06/07/2016 0817   GFRAA 102 06/07/2016 0817   Lab Results  Component Value Date   HGBA1C 5.2 06/07/2016   No results found for: "INSULIN" CBC    Component Value Date/Time   WBC 6.7 03/22/2023 0838   RBC 4.06 03/22/2023 0838   HGB 12.4 03/22/2023 0838   HGB 12.9 06/07/2016 0817   HCT 37.7 03/22/2023 0838   HCT 39.4 06/07/2016 0817   PLT 174 03/22/2023 0838   PLT 169 06/07/2016 0817   MCV 92.9 03/22/2023 0838   MCV 91 06/07/2016 0817   MCH 30.5 03/22/2023 0838   MCHC 32.9 03/22/2023 0838   RDW 12.7 03/22/2023 0838   RDW 13.3 06/07/2016 0817   Iron/TIBC/Ferritin/ %Sat No results found for: "IRON", "TIBC", "FERRITIN", "IRONPCTSAT" Lipid Panel     Component Value Date/Time   CHOL 136 06/07/2016 0817   TRIG 67 06/07/2016 0817   HDL 45 06/07/2016 0817   CHOLHDL 3.0 06/07/2016 0817   LDLCALC 78 06/07/2016 0817   Hepatic Function Panel     Component Value Date/Time   PROT 6.7 06/07/2016 0817   ALBUMIN 4.4 06/07/2016 0817   AST 15 06/07/2016 0817   ALT 13 06/07/2016 0817   ALKPHOS 83 06/07/2016 0817   BILITOT 0.4 06/07/2016 0817      Component Value Date/Time   TSH 4.500 06/07/2016 0817     Assessment and Plan:   Abnormal food appetite Assessment & Plan: She has increased orexigenic signaling, impaired satiety and inhibitory control. This is secondary to an abnormal energy regulation system and pathological neurohormonal pathways characteristic of excess adiposity.  In addition to nutritional and behavioral strategies she may benefit from pharmacotherapy.  We did review the timing as well as limitations associated with the use of antiobesity medications.  She had been on phentermine and Ozempic in the past with weight regain so would like for her to work first on nutritional and behavioral strategies before contemplating the use of antiobesity medications.    Chronic pain of both knees Assessment &  Plan: Patient has problems with chronic lower back pain and pain involving her knees likely due to biomechanical stress associated with her weight.  Losing 10 to 15% of body weight may improve condition.   Class 2 severe obesity with serious comorbidity and body mass index (BMI) of 36.0 to 36.9 in adult, unspecified obesity type [facility]) Assessment & Plan: Contributing factors:Family history of obesity, Disruption of circadian rhythm / sleep disordered breathing, Consumption of processed foods, Moderate to high levels of stress, Reduced physical activity, Eating patterns, Menopause, and Strong orexigenic signaling and inadequate inhibitory control   We reviewed anthropometrics, biometrics, associated medical conditions and contributing factors with patient. she would benefit from a medically tailored reduced calorie nutrional plan based on her REE (resting energy expenditure), which will be determined by indirect calorimetry.  We will also assess for cardiometabolic risk and nutritional derangements via fasting labs at intake appointment.     Centrilobular emphysema (HCC) Assessment & Plan: Found on CT scan of the chest for lung cancer screening.  I reviewed findings with patient she also has  a degree of bronchiectasis.  She has quit smoking and was congratulated on that.  She does acknowledge having dyspnea on exertion she may benefit from spirometry to determine if she has a degree of COPD.  I will defer further evaluation to primary care team.  Losing 10% of body weight may improve her breathing.  Patient will also be screened for sleep apnea at her intake appointment.            Obesity Treatment / Action Plan:  Patient will work on garnering support from family and friends to begin weight loss journey. Will work on eliminating or reducing the presence of highly palatable, calorie dense foods in the home. Will complete provided nutritional and psychosocial assessment questionnaire before  the next appointment. Will be scheduled for indirect calorimetry to determine resting energy expenditure in a fasting state.  This will allow us  to create a reduced calorie, high-protein meal plan to promote loss of fat mass while preserving muscle mass. Counseled on the health benefits of losing 5%-15% of total body weight. Was counseled on nutritional approaches to weight loss and benefits of reducing processed foods and consuming plant-based foods and high quality protein as part of nutritional weight management. Was counseled on pharmacotherapy and role as an adjunct in weight management.   Obesity Education Performed Today:  She was weighed on the bioimpedance scale and results were discussed and documented in the synopsis.  We discussed obesity as a disease and the importance of a more detailed evaluation of all the factors contributing to the disease.  We discussed the importance of long term lifestyle changes which include nutrition, exercise and behavioral modifications as well as the importance of customizing this to her specific health and social needs.  We discussed the benefits of reaching a healthier weight to alleviate the symptoms of existing conditions and reduce the risks of the biomechanical, metabolic and psychological effects of obesity.  Jenna Chapman appears to be in the action stage of change and states they are ready to start intensive lifestyle modifications and behavioral modifications.  I have spent in the care of the patient today including: 4 minutes before the visit reviewing and prepping the chart 42 minutes face-to-face assessing and reviewing listed medical problems as outlined in obesity care plan, providing nutritional and behavioral counseling on topics outlined in the obesity care plan, independently interpreting test results and goals of care, as described in assessment and plan, and reviewing and discussing biometric information and  progress 7 minutes after the visit updating chart and documentation    Reviewed by clinician on day of visit: allergies, medications, problem list, medical history, surgical history, family history, social history, and previous encounter notes pertinent to obesity diagnosis.   Ladd Picker, MD

## 2023-04-30 NOTE — Assessment & Plan Note (Signed)
 She has increased orexigenic signaling, impaired satiety and inhibitory control. This is secondary to an abnormal energy regulation system and pathological neurohormonal pathways characteristic of excess adiposity.  In addition to nutritional and behavioral strategies she may benefit from pharmacotherapy.  We did review the timing as well as limitations associated with the use of antiobesity medications.  She had been on phentermine and Ozempic in the past with weight regain so would like for her to work first on nutritional and behavioral strategies before contemplating the use of antiobesity medications.

## 2023-04-30 NOTE — Assessment & Plan Note (Signed)
 Contributing factors:Family history of obesity, Disruption of circadian rhythm / sleep disordered breathing, Consumption of processed foods, Moderate to high levels of stress, Reduced physical activity, Eating patterns, Menopause, and Strong orexigenic signaling and inadequate inhibitory control   We reviewed anthropometrics, biometrics, associated medical conditions and contributing factors with patient. she would benefit from a medically tailored reduced calorie nutrional plan based on her REE (resting energy expenditure), which will be determined by indirect calorimetry.  We will also assess for cardiometabolic risk and nutritional derangements via fasting labs at intake appointment.

## 2023-04-30 NOTE — Assessment & Plan Note (Signed)
 Patient has problems with chronic lower back pain and pain involving her knees likely due to biomechanical stress associated with her weight.  Losing 10 to 15% of body weight may improve condition.

## 2023-05-03 ENCOUNTER — Ambulatory Visit

## 2023-05-03 ENCOUNTER — Ambulatory Visit
Admission: RE | Admit: 2023-05-03 | Discharge: 2023-05-03 | Disposition: A | Discharge status: Home / Self Care | Source: Ambulatory Visit | Attending: Obstetrics and Gynecology | Admitting: Obstetrics and Gynecology

## 2023-05-03 DIAGNOSIS — N6489 Other specified disorders of breast: Secondary | ICD-10-CM

## 2023-05-13 ENCOUNTER — Ambulatory Visit: Admitting: Internal Medicine

## 2023-05-13 ENCOUNTER — Encounter: Payer: Self-pay | Admitting: Internal Medicine

## 2023-05-13 VITALS — BP 122/80 | HR 61 | Ht 67.0 in | Wt 243.6 lb

## 2023-05-13 DIAGNOSIS — R0602 Shortness of breath: Secondary | ICD-10-CM

## 2023-05-13 DIAGNOSIS — D169 Benign neoplasm of bone and articular cartilage, unspecified: Secondary | ICD-10-CM

## 2023-05-13 DIAGNOSIS — Z87891 Personal history of nicotine dependence: Secondary | ICD-10-CM | POA: Diagnosis not present

## 2023-05-13 DIAGNOSIS — J439 Emphysema, unspecified: Secondary | ICD-10-CM | POA: Diagnosis not present

## 2023-05-13 DIAGNOSIS — J4489 Other specified chronic obstructive pulmonary disease: Secondary | ICD-10-CM | POA: Diagnosis not present

## 2023-05-13 MED ORDER — STIOLTO RESPIMAT 2.5-2.5 MCG/ACT IN AERS: 2.0000 | INHALATION_SPRAY | Freq: Every day | RESPIRATORY_TRACT | 5 refills | Status: DC

## 2023-05-13 NOTE — Patient Instructions (Addendum)
 It was a pleasure to see you today!  Please schedule follow up with myself in 3 months.  If my schedule is not open yet, we will contact you with a reminder closer to that time. Please call [phone] if you haven't heard from us  a month before, and always call us  sooner if issues or concerns arise. You can also send us  a message through MyChart, but but aware that this is not to be used for urgent issues and it may take up to 5-7 days to receive a reply. Please be aware that you will likely be able to view your results before I have a chance to respond to them. Please give us  5 business days to respond to any non-urgent results.    VISIT SUMMARY:  Today, we discussed the findings from your recent CT scan and evaluated your lung health. We reviewed your history of smoking and its impact on your current condition. We also addressed your shortness of breath and the discomfort from your previous biopsy. 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  YOUR PLAN:  -COPD: Chronic obstructive pulmonary disease (COPD) is a long-term lung condition often caused by smoking, which makes it hard to breathe. We will conduct pulmonary function testing to assess the severity of your COPD. You are prescribed Stiolto Respimat, two puffs once daily, and should continue using your albuterol  inhaler as needed. We will get breathing testing prior to your next appointment.  -SMOKING CESSATION: You quit smoking in 2017, which is excellent. However, due to your smoking history, it is important to have annual low-dose CT scans to detect any early signs of lung cancer. Please continue with these annual scans until 2032. Your next scan would be in November 2025.  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 Your biopsy confirmed that it is not malignant. We will monitor it with follow-up imaging in  three months, and then every six months if it remains stable. For the discomfort, you can use warm compresses and topical pain relievers like aspercreme, bengay.   Understanding COPD   What is COPD? COPD stands for chronic obstructive pulmonary (lung) disease. COPD is a general term used for several lung diseases.  COPD is an umbrella term and encompasses other  common diseases in this group like chronic bronchitis and emphysema. Chronic asthma may also be included in this group. While some patients with COPD have only chronic bronchitis or emphysema, most patients have a combination of both.  You might hear these terms used in exchange for one another.   COPD adds to the work of the heart. Diseased lungs may reduce the amount of oxygen that goes to the blood. High blood pressure in blood vessels from the heart to the lungs makes it difficult for the heart to pump. Lung disease can also cause the body to produce too many red blood cells which may make the blood thicker and harder to pump.   Patients who have COPD with low oxygen levels may develop an enlarged heart (cor pulmonale). This condition weakens the heart and causes increased shortness of breath and swelling in the legs and feet.   Chronic bronchitis Chronic bronchitis is irritation and inflammation (swelling) of the lining in the bronchial tubes (air passages). The irritation causes coughing and an excess amount of mucus in the airways. The swelling makes it difficult to get air in and  out of the lungs. The small, hair-like structures on the inside of the airways (called cilia) may be damaged by the irritation. The cilia are then unable to help clean mucus from the airways.  Bronchitis is generally considered to be chronic when you have: a productive cough (cough up mucus) and shortness of breath that lasts about 3 months or more each year for 2 or more years in a row. Your doctor may define chronic bronchitis differently.    Emphysema Emphysema is the destruction, or breakdown, of the walls of the alveoli (air sacs) located at the end of the bronchial tubes. The damaged alveoli are not able to exchange oxygen and carbon dioxide between the lungs and the blood. The bronchioles lose their elasticity and collapse when you exhale, trapping air in the lungs. The trapped air keeps fresh air and oxygen from entering the lungs.   Who is affected by COPD? Emphysema and chronic bronchitis affect approximately 16 million people in the [place] , or close to 11 percent of the population.   Symptoms of COPD  Shortness of breath  Shortness of breath with mild exercise (walking, using the stairs, etc.)  Chronic, productive cough (with mucus)  A feeling of "tightness" in the chest  Wheezing   What causes COPD? The two primary causes of COPD are cigarette smoking and alpha1-antitrypsin (AAT) deficiency. Air pollution and occupational dusts may also contribute to COPD, especially when the person exposed to these substances is a cigarette smoker.  Cigarette smoke causes COPD by irritating the airways and creating inflammation that narrows the airways, making it more difficult to breathe. Cigarette smoke also causes the cilia to stop working properly so mucus and trapped particles are not cleaned from the airways. As a result, chronic cough and excess mucus production develop, leading to chronic bronchitis.  In some people, chronic bronchitis and infections can lead to destruction of the small airways, or emphysema.  AAT deficiency, an inherited disorder, can also lead to emphysema. Alpha antitrypsin (AAT) is a protective material produced in the liver and transported to the lungs to help combat inflammation. When there is not enough of the chemical AAT, the body is no longer protected from an enzyme in the white blood cells.   How is COPD diagnosed?  To diagnose COPD, the physician needs to know: Do you smoke?  Have you had  chronic exposure to dust or air pollutants?  Do other members of your family have lung disease?  Are you short of breath?  Do you get short of breath with exercise?  Do you have chronic cough and/or wheezing?  Do you cough up excess mucus?  To help with the diagnosis, the physician will conduct a thorough physical exam which includes:  Listening to your lungs and heart  Checking your blood pressure and pulse  Examining your nose and throat  Checking your feet and ankles for swelling   Laboratory and other tests Several laboratory and other tests are needed to confirm a diagnosis of COPD. These tests may include:  Chest X-ray to look for lung changes that could be caused by COPD   Spirometry and pulmonary function tests (PFTs) to determine lung volume and air flow  Pulse oximetry to measure the saturation of oxygen in the blood  Arterial blood gases (ABGs) to determine the amount of oxygen and carbon dioxide in the blood  Exercise testing to determine if the oxygen level in the blood drops during exercise   Treatment In the beginning  stages of COPD, there is minimal shortness of breath that may be noticed only during exercise. As the disease progresses, shortness of breath may worsen and you may need to wear an oxygen device.   To help control other symptoms of COPD, the following treatments and lifestyle changes may be prescribed.  Quitting smoking  Avoiding cigarette smoke and other irritants  Taking medications including: a. bronchodilators b. anti-inflammatory agents c. oxygen d. antibiotics  Maintaining a healthy diet  Following a structured exercise program such as pulmonary rehabilitation Preventing respiratory infections  Controlling stress   If your COPD progresses, you may be eligible to be evaluated for lung volume reduction surgery or lung transplantation. You may also be eligible to participate in certain clinical trials (research studies). Ask your health care  providers about studies being conducted in your hospital.   What is the outlook? Although COPD can not be cured, its symptoms can be treated and your quality of life can be improved. Your prognosis or outlook for the future will depend on how well your lungs are functioning, your symptoms, and how well you respond to and follow your treatment plan.

## 2023-05-13 NOTE — Progress Notes (Signed)
 Jenna Chapman    [id number]    October 14, 1970  Primary Care Physician:Vanstory, Alison Applebaum, PA  Referring Physician: Merryl Abraham, MD [address] [place],  [place] [address] Reason for Consultation: abnormal CT Chest Date of Consultation: 05/13/2023  Chief complaint:   Chief Complaint  Patient presents with   Consult     HPI:  Discussed the use of AI scribe software for clinical note transcription with the patient, who gave verbal consent to proceed.  History of Present Illness Jenna Chapman is a 53 year old female who presents for evaluation of lung abnormalities found on a CT scan. She is accompanied by her husband. She was referred by Dr. McComb for evaluation following a CT scan ordered for lung cancer screening.  She was unaware of any lung issues until a CT scan, performed as part of a routine check-up, revealed abnormalities. She has a history of smoking three packs a day for approximately 30 years, having quit eight years ago.  She experiences shortness of breath, particularly when climbing stairs or walking, which has worsened over the past year. This has impacted her ability to walk as she used to. She uses an albuterol  inhaler occasionally when short of breath, which provides relief.  She has a history of a sternal mass, identified as a benign chondroid proliferation, which was biopsied at the end of March. She experiences discomfort in the area, particularly when bending down, and reports increased soreness following the biopsy.  She has a history of bronchitis approximately once a year, with the most recent episode occurring this year. No hospitalizations for respiratory issues.  Her family history includes a sister with unspecified lung problems and a mother who smoked and had COPD. Her mother is deceased.  She is a Social research officer, government at [employer] and lives with her husband and a 54 year old terrier. She quit smoking following her husband's heart  issues, which prompted both to stop smoking.   Social history:  Occupation: Social research officer, government - [employer]  Exposures: lives at home with husband, dog Smoking history: 90 pack years, quit 2017  Social History   Occupational History   Occupation: KEY Set designer: [employer]    Comment: [employer]  Tobacco Use   Smoking status: Former    Current packs/day: 0.00    Average packs/day: 3.0 packs/day for 32.0 years (96.0 ttl pk-yrs)    Types: Cigarettes    Start date: 9    Quit date: 2017    Years since quitting: 8.3   Smokeless tobacco: Never   Tobacco comments:    Quit in May 2017  Substance and Sexual Activity   Alcohol use: No    Alcohol/week: 0.0 standard drinks of alcohol   Drug use: No   Sexual activity: Not on file    Relevant family history:  Family History  Problem Relation Age of Onset   High Cholesterol Mother    High blood pressure Mother    Lung cancer Mother        06-15-14 diagnosed   COPD Mother     Past Medical History:  Diagnosis Date   HA (headache)    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 Gen:  No acute distress ENT:  no nasal polyps, mucus membranes moist Lungs:    No increased respiratory effort, symmetric chest wall excursion, clear to auscultation bilaterally, diminished, no wheezes or crackles CV:         Regular rate and rhythm; no murmurs, rubs, or gallops.  No pedal edema Abd:      + bowel sounds; soft, non-tender; no distension MSK: no acute synovitis of DIP or PIP joints, no mechanics hands.  Skin:      Warm and dry; no rashes Neuro: normal speech, no focal facial asymmetry Psych: alert and oriented x3, normal mood and affect   Data Reviewed/Medical Decision Making:  Independent interpretation of tests: Imaging:  Review of  patient's CT Chest Nov 2024 images revealed emphysema, mild bronchiectasis. The patient's images have been independently reviewed by me.    PFTs:  Labs:  Lab Results  Component Value Date   NA 143 06/07/2016   K 4.3 06/07/2016   CO2 25 06/07/2016   GLUCOSE 91 06/07/2016   BUN 16 06/07/2016   CREATININE 0.80 06/07/2016   CALCIUM 9.3 06/07/2016   GFRNONAA 89 06/07/2016   Lab Results  Component Value Date   WBC 6.7 03/22/2023   HGB 12.4 03/22/2023   HCT 37.7 03/22/2023   MCV 92.9 03/22/2023   PLT 174 03/22/2023     Immunization status:  Immunization History  Administered Date(s) Administered   Influenza,inj,Quad PF,6+ Mos 01/05/2016     I reviewed prior external note(s) from primary care. Thoracic surgery  I reviewed the result(s) of the labs and imaging as noted above.   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 CT scan shows airway thickening and mucus. Albuterol  inhaler provides relief, indicating reversible airway obstruction. Discussed smoking cessation benefits. - Order pulmonary function testing to assess COPD severity. - Prescribe Stiolto Respimat, two puffs once daily. - Continue albuterol  inhaler as needed. - Schedule follow-up CT scan in November 2025. - Monitor for infection or worsening symptoms, currently asymptomatic from MAI standpoint.  Smoking cessation, need for lung cancer screning Quit smoking in 2017. CT scan shows changes consistent with smoking history. Explained necessity of annual low-dose CT scans for early detection of lung cancer. - Continue annual low-dose CT scans until 2032.  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 Mild discomfort likely due to nerve irritation. No malignancy or significant growth. - Monitor with follow-up imaging in three months, then every six months  if stable. - Recommend warm compresses and topical analgesics for discomfort.    Return to Care: Return in about 3 months (around 08/13/2023).  Louie Rover, MD Pulmonary and Critical Care Medicine  [facility] Office:[phone]  CC: McComb, John, MD

## 2023-05-13 NOTE — Progress Notes (Signed)
The patient has been prescribed the inhaler stiolto. Inhaler technique was demonstrated to patient. The patient subsequently demonstrated correct technique.

## 2023-06-05 ENCOUNTER — Encounter (INDEPENDENT_AMBULATORY_CARE_PROVIDER_SITE_OTHER): Payer: Self-pay

## 2023-06-11 ENCOUNTER — Telehealth: Payer: Self-pay | Admitting: Internal Medicine

## 2023-06-11 ENCOUNTER — Ambulatory Visit: Payer: Self-pay

## 2023-06-11 NOTE — Telephone Encounter (Signed)
 FYI Only or Action Required?: Action required by provider  Patient is followed in Pulmonology for COPD, Emphysema, last seen on 05/13/2023 by Aleck Hurdle, MD. Called Nurse Triage reporting No chief complaint on file.. Symptoms began today. Interventions attempted: Nothing. Symptoms are: unchanged.  Triage Disposition: No disposition on file.  Patient/caregiver understands and will follow disposition?:  Answer Assessment - Initial Assessment Questions 1. DRUG NAME: "What medicine do you need to have refilled?"     VENTOLIN  (Albuterol ) 2. REFILLS REMAINING: "How many refills are remaining?" (Note: The label on the medicine or pill bottle will show how many refills are remaining. If there are no refills remaining, then a renewal may be needed.)     0 3. EXPIRATION DATE: "What is the expiration date?" (Note: The label states when the prescription will expire, and thus can no longer be refilled.)     Unknown 4. PRESCRIBING HCP: "Who prescribed it?" Reason: If prescribed by specialist, call should be referred to that group.     Historical Provider  Protocols used: Medication Refill and Renewal Call-A-AH

## 2023-06-11 NOTE — Telephone Encounter (Unsigned)
 Copied from CRM [id number]. Topic: Clinical - Medication Refill >> Jun 11, 2023  3:10 PM Buelah Carmel J wrote: Medication: albuterol  (VENTOLIN  HFA) 108 (90 Base) MCG/ACT inhaler, discussed at previous appt w/Desai about this medication.    Has the patient contacted their pharmacy? Yes (Agent: If no, request that the patient contact the pharmacy for the refill. If patient does not wish to contact the pharmacy document the reason why and proceed with request.) (Agent: If yes, when and what did the pharmacy advise?)  This is the patient's preferred pharmacy:  [facility] [address] ([address]), [place] - [address] [place] ([place]) [place] [address] Phone: [phone] Fax: [phone]  Is this the correct pharmacy for this prescription? Yes If no, delete pharmacy and type the correct one.   Has the prescription been filled recently? No  Is the patient out of the medication? Yes  Has the patient been seen for an appointment in the last year OR does the patient have an upcoming appointment? Yes  Can we respond through MyChart? Yes  Agent: Please be advised that Rx refills may take up to 3 business days. We ask that you follow-up with your pharmacy.

## 2023-06-20 ENCOUNTER — Ambulatory Visit (INDEPENDENT_AMBULATORY_CARE_PROVIDER_SITE_OTHER): Admitting: Family Medicine

## 2023-06-20 ENCOUNTER — Encounter (INDEPENDENT_AMBULATORY_CARE_PROVIDER_SITE_OTHER): Payer: Self-pay | Admitting: Family Medicine

## 2023-06-20 VITALS — BP 119/78 | HR 71 | Temp 97.9°F | Ht 67.0 in | Wt 237.4 lb

## 2023-06-20 DIAGNOSIS — Z1331 Encounter for screening for depression: Secondary | ICD-10-CM

## 2023-06-20 DIAGNOSIS — K219 Gastro-esophageal reflux disease without esophagitis: Secondary | ICD-10-CM | POA: Diagnosis not present

## 2023-06-20 DIAGNOSIS — Z6837 Body mass index (BMI) 37.0-37.9, adult: Secondary | ICD-10-CM | POA: Diagnosis not present

## 2023-06-20 DIAGNOSIS — R0602 Shortness of breath: Secondary | ICD-10-CM

## 2023-06-20 DIAGNOSIS — F5089 Other specified eating disorder: Secondary | ICD-10-CM | POA: Diagnosis not present

## 2023-06-20 DIAGNOSIS — E669 Obesity, unspecified: Secondary | ICD-10-CM | POA: Insufficient documentation

## 2023-06-20 DIAGNOSIS — R5383 Other fatigue: Secondary | ICD-10-CM | POA: Diagnosis not present

## 2023-06-20 DIAGNOSIS — F39 Unspecified mood [affective] disorder: Secondary | ICD-10-CM | POA: Insufficient documentation

## 2023-06-20 DIAGNOSIS — J432 Centrilobular emphysema: Secondary | ICD-10-CM

## 2023-06-20 NOTE — Progress Notes (Signed)
 [name], D.O.  ABFM, ABOM Specializing in Clinical Bariatric Medicine Office located at: [address] [place], [place]  [address]   Bariatric Medicine Visit  Dear Rosalea Rosina SAILOR, PA   Thank you for referring Manette Doto to our clinic today for evaluation.  We performed a consultation to discuss her options for treatment and educate the patient on her disease state.  The following note includes my evaluation and treatment recommendations.   Please do not hesitate to reach out to me directly if you have any further concerns.   Assessment and Plan:   Orders Placed This Encounter  Procedures   Vitamin B12   VITAMIN D  25 Hydroxy (Vit-D Deficiency, Fractures)   Folate   Hemoglobin A1c   Insulin , random   T4, free   TSH   Comprehensive metabolic panel with GFR   CBC with Differential/Platelet   Lipid panel   EKG 12-Lead   FOR THE DISEASE OF OBESITY:  Recommended Dietary Goals Chelli is currently in the action stage of change. As such, her goal is to start our weight management plan.  She has agreed to implement the Category 2 MP with B & L options.   Behavioral Intervention We discussed the following today: continue to work on maintaining a reduced calorie state, getting the recommended amount of protein, incorporating whole foods, making healthy choices, staying well hydrated and practicing mindfulness when eating.  Additional resources provided today: Handout on CAT 2 meal plan, Handout on CAT 1-2 breakfast options, and Handout on CAT 1-2 lunch options  Evidence-based interventions for health behavior change were utilized today including the discussion of self monitoring techniques, problem-solving barriers and SMART goal setting techniques.    Pt will specifically work on measuring her intake of veggies and lean proteins and eliminating her liquid calorie intake.    Recommended Physical Activity Goals Lizzie has been advised to work up to 150  minutes of moderate intensity aerobic activity a week and strengthening exercises 2-3 times per week for cardiovascular health, weight loss maintenance and preservation of muscle mass.   She has agreed to maintain current level of activity.    Pharmacotherapy Was on Phentermine and Ozempic in the past.    ASSOCIATED CONDITIONS ADDRESSED TODAY:   Fatigue Assessment & Plan: Ellington does feel that her weight is causing her energy to be lower than it should be. Fatigue may be related to obesity, depression or many other causes. she does not appear to have any red flag symptoms and this appears to most likely be related to her current lifestyle habits and dietary intake.  Epworth sleepiness scale is 15 and appears to not be within normal limits. Shaindy admits to daytime somnolence and admits to waking up still tired. Patient has morning headaches twice weekly. Sierrah generally gets 6 hours of sleep per night, and states that she has generally unrestful sleep. Snoring is present per her husband.    Discussed proper sleep hygiene practices and encouraged pt to aim for 7-9 hrs of sleep per night for the best weight loss results and for her overall health/well being.    Extensive education provided regarding her elevated ESS score. I highly recommend she follow up with her PCP about further evaluation for potential sleep apnea.   ECG: Performed and reviewed/ interpreted independently. Sinus bradycardia, rate 58 bpm; reassuring without any acute abnormalities, will continue to monitor for symptoms     Shortness of breath on exertion Assessment & Plan: Samaira  does feel that she gets out of breath more easily than she used to when she exercises and seems to be worsening over time with weight gain.  This has gotten worse recently. Isatou denies shortness of breath at rest or orthopnea. Pt denies chest pain, dizziness, heart palpitations, or excessive diaphoresis or nausea with activity.  This is not new and  is ongoing.  Revella's shortness of breath appears to be obesity related and exercise induced, as they do not appear to have any red flag symptoms/ concerns today.  Also, this condition appears to be related to a state of poor cardiovascular conditioning   Indirect Calorimeter completed today to help guide our dietary regimen. It shows a VO2 of 246 and a REE of 1699.  Her calculated basal metabolic rate is 8244 thus her resting energy expenditure is worse than expected.  Patient agreed to work on weight loss at this time.  As Louna progresses through our weight loss program, we will gradually increase exercise as tolerated to treat her current condition.   If Toshua follows our recommendations and loses 5-10% of their weight without improvement of her shortness of breath or if at any time, symptoms become more concerning, they agree to urgently follow up with their PCP/ specialist for further consideration/ evaluation.   Kylynn verbalizes agreement with this plan.     Mood disorder (HCC) - emotional eating/ Depression Screen  Assessment & Plan: Flowsheet Row Office Visit from 06/20/2023 in [facility] Total Score 22    Relevant medication: Citalopram 20 mg daily.   Her PHQ-9 is elevated at 22. Denies any SI/HI. Mood is stable. Emotional eating wise, she endorses sometimes eating when stressed, sad, and to help comfort herself.     Continue with Citalopram. Extensive discussion had regarding her PHQ-9 questionnaire/score. Highly recommend she follow up with her PCP for further care. Patient was REFERRED to Dr. Sharron, our Bariatric Psychologist, for evaluation due to her issues with emotional eating. Encouraged to also obtain general counselor. Discussed that eating healthy/beginning her prudent nutritional plan can support her emotional well-being.     Centrilobular emphysema (HCC) Assessment & Plan: Found on CT scan of the chest for lung cancer  screening. She has quit smoking. She recently met with an outside pulmonology provider and so I cannot see the OV note. She does not remember what she was exactly diagnosed with at that visit, but does not recall being diagnosed with COPD, Reactive Airway disease, or Asthma. She was placed on STIOLTO RESPIMAT  2 puffs into the lungs daily. Has not had to use her short acting Ventolin  for rescue. Her breathing has gotten better.   Continue care per pulmonology whom she follows up with in August ; recommend she ask what her diagnosis was. Losing 10% of body weight may improve condition.     Gastroesophageal reflux disease, unspecified whether esophagitis present Assessment & Plan: Per hx. Sometimes she takes an OTC medication to control symptoms. Overall condition is well controlled. No acute concerns. Begin prudent nutritional plan and wt loss efforts.     FOLLOW UP:   Follow up in 2 weeks. She was informed of the importance of frequent follow up visits to maximize her success with intensive lifestyle modifications for her multiple health conditions.  [name] is aware that we will review all of her lab results at our next visit.  She is aware that if anything is critical/ life threatening with the  results, we will be contacting her via MyChart prior to the office visit to discuss management.     Chief Complaint:   OBESITY Tationna Fullard (MR# [id number]) is a pleasant 53 y.o. female who presents for evaluation and treatment of obesity and related comorbidities. She is accompanied by her husband Chyrl.   Current BMI is Body mass index is 37.18 kg/m. [name] has been struggling with her weight for many years and has been unsuccessful in either losing weight, maintaining weight loss, or reaching her healthy weight goal.  [name] is currently in the action stage of change and ready to dedicate time achieving and maintaining a healthier weight. Delmi Fulfer  is interested in becoming our patient and working on intensive lifestyle modifications including (but not limited to) diet and exercise for weight loss.  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 Patient has 2 children. She lives with her husband Chyrl.   Started gaining weight after having surgery; the only surgery she has listed in her hx is a bladder sling in 2012.   Pt has unrealistic expectations and desires  to be 150 lbs in 6 months.   Has never tried a formal diet plan in the past but has tried eliminating sugary foods/drinks.   Eats outside the home 3x/ week including fast food or take out 2x/week.  Craves sweets   Snacks on chips, ice cream and some fruit.   Sometimes skips meals.   Drinks several caloric beverages including coffee + creamer + milk, fruit smoothies, and 2-3 red bulls/day.   Was told by a physician in the past to cut back on sugars, carbs, and caffeine.  Worst food habit: chips    Subjective:   This is the patient's first visit at [facility].  The patient's NEW PATIENT PACKET that they filled out prior to today's office visit was reviewed at length and information from that paperwork was included within the following office visit note.    Included in the packet: current and past health history, medications, allergies, ROS, gynecologic history (women only), surgical history, family history, social history, weight history, weight loss surgery history (for those that have had weight loss surgery), nutritional evaluation, mood and food questionnaire along with a depression screening (PHQ9) on all patients, an Epworth questionnaire, sleep habits questionnaire, patient life and health improvement goals questionnaire. These will all be scanned into the patient's chart under the media tab.   Review of Systems: Please refer to new patient packet scanned into media. Pertinent positives were addressed with  patient today.  Reviewed by clinician on day of visit: allergies, medications, problem list, medical history, surgical history, family history, social history, and previous encounter notes.  During the visit, I independently reviewed the patient's EKG, bioimpedance scale results, and indirect calorimeter results. I used this information to tailor a meal plan for the patient that will help [name] to lose weight and will improve her obesity-related conditions going forward.  I performed a medically necessary appropriate examination and/or evaluation. I discussed the assessment and treatment plan with the patient. The patient was provided an opportunity to ask questions and all were answered. The patient agreed with the plan and demonstrated an understanding of the instructions. Labs were ordered today (unless patient declined them) and will be reviewed with the patient at our next visit unless more critical results need to be addressed immediately. Clinical information was  updated and documented in the EMR.    Objective:   PHYSICAL EXAM: Blood pressure 119/78, pulse 71, temperature 97.9 F (36.6 C), height 5' 7 (1.702 m), weight 237 lb 6.4 oz (107.7 kg), SpO2 94%. Body mass index is 37.18 kg/m.  General: Well Developed, well nourished, and in no acute distress.  HEENT: Normocephalic, atraumatic; EOMI, sclerae are anicteric. Skin: Warm and dry, good turgor Chest:  Normal excursion, shape, no gross ABN Respiratory: No conversational dyspnea; speaking in full sentences NeuroM-Sk:  Normal gross ROM * 4 extremities  Psych: A and O *3, insight adequate, mood- full   Anthropometric Measurements Height: 5' 7 (1.702 m) Weight: 237 lb 6.4 oz (107.7 kg) BMI (Calculated): 37.17 Weight at Last Visit: N/A Weight Lost Since Last Visit: N/A Weight Gained Since Last Visit: N/A Starting Weight: 237 lb Peak Weight: 240 lb Waist Measurement : 51 inches   Body Composition  Body Fat %: 47.8  % Fat Mass (lbs): 113.4 lbs Muscle Mass (lbs): 117.8 lbs Total Body Water (lbs): 83.8 lbs Visceral Fat Rating : 13   Other Clinical Data RMR: 1699 Fasting: Yes Labs: Yes Today's Visit #: 1 Starting Date: 06/20/23 Comments: First Visit    DIAGNOSTIC DATA REVIEWED:  BMET    Component Value Date/Time   NA 143 06/07/2016 0817   K 4.3 06/07/2016 0817   CL 104 06/07/2016 0817   CO2 25 06/07/2016 0817   GLUCOSE 91 06/07/2016 0817   GLUCOSE 90 06/24/2014 1928   BUN 16 06/07/2016 0817   CREATININE 0.80 06/07/2016 0817   CREATININE 0.84 06/24/2014 1928   CALCIUM 9.3 06/07/2016 0817   GFRNONAA 89 06/07/2016 0817   GFRAA 102 06/07/2016 0817   Lab Results  Component Value Date   HGBA1C 5.2 06/07/2016   No results found for: INSULIN  Lab Results  Component Value Date   TSH 4.500 06/07/2016   CBC    Component Value Date/Time   WBC 6.7 03/22/2023 0838   RBC 4.06 03/22/2023 0838   HGB 12.4 03/22/2023 0838   HGB 12.9 06/07/2016 0817   HCT 37.7 03/22/2023 0838   HCT 39.4 06/07/2016 0817   PLT 174 03/22/2023 0838   PLT 169 06/07/2016 0817   MCV 92.9 03/22/2023 0838   MCV 91 06/07/2016 0817   MCH 30.5 03/22/2023 0838   MCHC 32.9 03/22/2023 0838   RDW 12.7 03/22/2023 0838   RDW 13.3 06/07/2016 0817   Iron Studies No results found for: IRON, TIBC, FERRITIN, IRONPCTSAT Lipid Panel     Component Value Date/Time   CHOL 136 06/07/2016 0817   TRIG 67 06/07/2016 0817   HDL 45 06/07/2016 0817   CHOLHDL 3.0 06/07/2016 0817   LDLCALC 78 06/07/2016 0817   Hepatic Function Panel     Component Value Date/Time   PROT 6.7 06/07/2016 0817   ALBUMIN 4.4 06/07/2016 0817   AST 15 06/07/2016 0817   ALT 13 06/07/2016 0817   ALKPHOS 83 06/07/2016 0817   BILITOT 0.4 06/07/2016 0817      Component Value Date/Time   TSH 4.500 06/07/2016 0817   Nutritional No results found for: VD25OH  Attestation Statements:   I, Special Puri, acting as a Stage manager for  [employer], DO., have compiled all relevant documentation for today's office visit on behalf of Barnie Jenkins, DO, while in the presence of [employer], DO.  I have spent 63 minutes in the care of the patient today including 49 minutes face-to-face assessing and reviewing listed medical problems  above as outlined in office visit note and providing nutritional and behavioral counseling as outlined in obesity care plan.   I have reviewed the above documentation for accuracy and completeness, and I agree with the above. Barnie JINNY Jenkins, D.O.  The 21st Century Cures Act was signed into law in 2016 which includes the topic of electronic health records.  This provides immediate access to information in MyChart.  This includes consultation notes, operative notes, office notes, lab results and pathology reports.  If you have any questions about what you read please let us  know at your next visit so we can discuss your concerns and take corrective action if need be.  We are right here with you.

## 2023-06-21 LAB — CBC WITH DIFFERENTIAL/PLATELET
Basophils Absolute: 0 10*3/uL (ref 0.0–0.2)
Basos: 1 %
EOS (ABSOLUTE): 0.2 10*3/uL (ref 0.0–0.4)
Eos: 4 %
Hematocrit: 39.8 % (ref 34.0–46.6)
Hemoglobin: 13 g/dL (ref 11.1–15.9)
Immature Grans (Abs): 0 10*3/uL (ref 0.0–0.1)
Immature Granulocytes: 0 %
Lymphocytes Absolute: 1.5 10*3/uL (ref 0.7–3.1)
Lymphs: 32 %
MCH: 30.7 pg (ref 26.6–33.0)
MCHC: 32.7 g/dL (ref 31.5–35.7)
MCV: 94 fL (ref 79–97)
Monocytes Absolute: 0.4 10*3/uL (ref 0.1–0.9)
Monocytes: 8 %
Neutrophils Absolute: 2.6 10*3/uL (ref 1.4–7.0)
Neutrophils: 55 %
Platelets: 220 10*3/uL (ref 150–450)
RBC: 4.24 x10E6/uL (ref 3.77–5.28)
RDW: 12.1 % (ref 11.7–15.4)
WBC: 4.6 10*3/uL (ref 3.4–10.8)

## 2023-06-21 LAB — LIPID PANEL
Chol/HDL Ratio: 3.2 ratio (ref 0.0–4.4)
Cholesterol, Total: 167 mg/dL (ref 100–199)
HDL: 52 mg/dL (ref >39)
LDL Chol Calc (NIH): 101 mg/dL — ABNORMAL HIGH (ref 0–99)
Triglycerides: 76 mg/dL (ref 0–149)
VLDL Cholesterol Cal: 14 mg/dL (ref 5–40)

## 2023-06-21 LAB — TSH: TSH: 1.77 u[IU]/mL (ref 0.450–4.500)

## 2023-06-21 LAB — COMPREHENSIVE METABOLIC PANEL WITH GFR
ALT: 34 IU/L — ABNORMAL HIGH (ref 0–32)
AST: 31 IU/L (ref 0–40)
Albumin: 4.3 g/dL (ref 3.8–4.9)
Alkaline Phosphatase: 92 IU/L (ref 44–121)
BUN/Creatinine Ratio: 21 (ref 9–23)
BUN: 17 mg/dL (ref 6–24)
Bilirubin Total: 0.6 mg/dL (ref 0.0–1.2)
CO2: 22 mmol/L (ref 20–29)
Calcium: 9.5 mg/dL (ref 8.7–10.2)
Chloride: 101 mmol/L (ref 96–106)
Creatinine, Ser: 0.82 mg/dL (ref 0.57–1.00)
Globulin, Total: 2.7 g/dL (ref 1.5–4.5)
Glucose: 87 mg/dL (ref 70–99)
Potassium: 4.6 mmol/L (ref 3.5–5.2)
Sodium: 139 mmol/L (ref 134–144)
Total Protein: 7 g/dL (ref 6.0–8.5)
eGFR: 85 mL/min/{1.73_m2} (ref >59)

## 2023-06-21 LAB — T4, FREE: Free T4: 1.07 ng/dL (ref 0.82–1.77)

## 2023-06-21 LAB — VITAMIN B12: Vitamin B-12: 1049 pg/mL (ref 232–1245)

## 2023-06-21 LAB — VITAMIN D 25 HYDROXY (VIT D DEFICIENCY, FRACTURES): Vit D, 25-Hydroxy: 45.8 ng/mL (ref 30.0–100.0)

## 2023-06-21 LAB — INSULIN, RANDOM: INSULIN: 12.4 u[IU]/mL (ref 2.6–24.9)

## 2023-06-21 LAB — HEMOGLOBIN A1C
Est. average glucose Bld gHb Est-mCnc: 105 mg/dL
Hgb A1c MFr Bld: 5.3 % (ref 4.8–5.6)

## 2023-06-21 LAB — FOLATE: Folate: 17.8 ng/mL (ref >3.0)

## 2023-06-25 ENCOUNTER — Other Ambulatory Visit: Payer: Self-pay | Admitting: Thoracic Surgery (Cardiothoracic Vascular Surgery)

## 2023-06-25 DIAGNOSIS — M898X8 Other specified disorders of bone, other site: Secondary | ICD-10-CM

## 2023-07-04 ENCOUNTER — Encounter (INDEPENDENT_AMBULATORY_CARE_PROVIDER_SITE_OTHER): Payer: Self-pay | Admitting: Family Medicine

## 2023-07-04 ENCOUNTER — Ambulatory Visit (INDEPENDENT_AMBULATORY_CARE_PROVIDER_SITE_OTHER): Admitting: Family Medicine

## 2023-07-04 VITALS — BP 129/83 | HR 67 | Temp 98.7°F | Ht 67.0 in | Wt 235.0 lb

## 2023-07-04 DIAGNOSIS — Z6836 Body mass index (BMI) 36.0-36.9, adult: Secondary | ICD-10-CM

## 2023-07-04 DIAGNOSIS — K760 Fatty (change of) liver, not elsewhere classified: Secondary | ICD-10-CM | POA: Diagnosis not present

## 2023-07-04 DIAGNOSIS — E88819 Insulin resistance, unspecified: Secondary | ICD-10-CM | POA: Diagnosis not present

## 2023-07-04 DIAGNOSIS — E7800 Pure hypercholesterolemia, unspecified: Secondary | ICD-10-CM

## 2023-07-04 DIAGNOSIS — E669 Obesity, unspecified: Secondary | ICD-10-CM | POA: Diagnosis not present

## 2023-07-04 NOTE — Progress Notes (Signed)
 Jenna Chapman, D.O.  ABFM, ABOM Clinical Bariatric Medicine Physician  Office located at: [address] [place], [place]  [address]   Assessment and Plan:   FOR THE DISEASE OF OBESITY:  BMI 36.0-36.9,adult current 36.81 Obesity, Beginning BMI 37.2 Assessment & Plan: Since last office visit on 06/20/2023 patient's  Muscle mass has decreased by 0.4 lb. Fat mass has decreased by 1.6 lb. Total body water has increased by 1.6 lb.  Counseling done on how various foods will affect these numbers and how to maximize success  Total lbs lost to date: - 2 lbs  Total weight loss percentage to date: -0.84%     Recommended Dietary Goals Jenna Chapman is currently in the action stage of change. As such, her goal is to continue weight management plan.  She has agreed to: continue current plan   Behavioral Intervention We discussed the following today: high protein yogurt options,  increasing lean protein intake to established goals, increasing water intake , work on meal planning and preparation, and continue to practice mindfulness when eating  Additional resources provided today: Handout on various breakfast recipes, Handout on practicing mindfulness around eating, Handout on CAT 2 meal plan, and Handout on insulin  resistance education  Evidence-based interventions for health behavior change were utilized today including the discussion of self monitoring techniques, problem-solving barriers and SMART goal setting techniques.  Regarding patient's less desirable eating habits and patterns, we employed the technique of small changes.   Pt will work on being mindful of her portions (e.g measuring her protein/veggie intake) and meal planning/prepping.   Recommended Physical Activity Goals Anajulia has been advised to work up to 150 minutes of moderate intensity aerobic activity a week and strengthening exercises 2-3 times per week for cardiovascular health, weight loss maintenance and preservation of  muscle mass.   She has agreed to : continue to gradually increase the amount and intensity of exercise routine   Pharmacotherapy We both agreed to : Continue with current nutritional and behavioral strategies   ASSOCIATED CONDITIONS ADDRESSED TODAY:   Insulin  resistance - new onset Assessment & Plan: Lab Results  Component Value Date   HGBA1C 5.3 06/20/2023   HGBA1C 5.2 06/07/2016   INSULIN  12.4 06/20/2023   Lab Results  Component Value Date   CREATININE 0.82 06/20/2023   BUN 17 06/20/2023   NA 139 06/20/2023   K 4.6 06/20/2023   CL 101 06/20/2023   CO2 22 06/20/2023   Lab Results  Component Value Date   WBC 4.6 06/20/2023   HGB 13.0 06/20/2023   HCT 39.8 06/20/2023   MCV 94 06/20/2023   PLT 220 06/20/2023   Lab Results  Component Value Date   TSH 1.770 06/20/2023   FREET4 1.07 06/20/2023   Lab Results  Component Value Date   VITAMINB12 1,049 06/20/2023   Lab Results  Component Value Date   FOLATE 17.8 06/20/2023   Lab Results  Component Value Date   VD25OH 45.8 06/20/2023   Fasting insulin  is roughly 2 times normal. Reviewed recent Hemoglobin A1c, kidney function, blood counts, thyroid labs, B12,  Folate, Vitamin D  (last took a VD supplement 4-5 months ago) which are within acceptable ranges.   Patient aware of disease state and risk of progression. Handout on I.R provided. Explained role of simple carbs and insulin  levels on hunger and cravings. Educated patient that having adequate amounts of protein with each meal is important for increasing muscle mass, stabilizing sugars, controlling hunger and cravings, and improving  thermogenesis. Continue prudent nutritional plan. Losing 10% or more of adipose tissue may improve insulin  resistance. Encouraged pt to read more about this condition on the American Diabetes Association website.    Elevated LDL cholesterol level - new onset Assessment & Plan: Lab Results  Component Value Date   CHOL 167 06/20/2023    HDL 52 06/20/2023   LDLCALC 101 (H) 06/20/2023   TRIG 76 06/20/2023   CHOLHDL 3.2 06/20/2023   The 10-year ASCVD risk score (Arnett DK, et al., 2019) is: 1.4%   Values used to calculate the score:     Age: 39 years     Clincally relevant sex: Female     Is Non-Hispanic African American: No     Diabetic: No     Tobacco smoker: No     Systolic Blood Pressure: 129 mmHg     Is BP treated: No     HDL Cholesterol: 52 mg/dL     Total Cholesterol: 167 mg/dL  Reviewed recent lipid panel above. Her LDL is slightly elevated at 101. Continue to work on nutrition plan -decreasing simple carbohydrates, increasing lean proteins, decreasing saturated fats and cholesterol, avoiding trans fats and exercise as able to promote weight loss and improve LDL.     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  I feel this is likely MASLD although I cannot see any imaging of her abdomen to truly confirm Dx. Continue with reducing saturated fats, simple and added sugars. Losing 10% of adipose tissue may improve condition.     FOLLOW UP:   Return 07/31/2023 at 9:00 AM. She was informed of the importance of frequent follow up visits to maximize her success with intensive lifestyle modifications for her multiple health conditions.   Subjective:   Chief complaint: Obesity Jenna Chapman is here to discuss her progress with her obesity treatment plan. She is on the Category 2 Plan with [employer] options and states she is following her eating plan approximately 50% of the time. She states she is exercising walking 40 minutes 5 days per week.  Interval History:  Jenna Chapman is here today for her first follow-up office visit  since starting the program with us . Patient is off to a good start and has lost 2 lbs. She did not enjoy certain aspects of her meal plan. For example, she prefers regular milk over Fair Life milk. She also is getting tired of having eggs for breakfast every day. Endorses not measuring her protein and veggie intake. Has cut down from 4-5 red bulls per day to 1 per day. Has an appointment with Dr.Barker on 07/15/2023.     Pharmacotherapy for weight loss: none  Review of Systems:  Pertinent positives were addressed with patient today.   Reviewed by clinician on day of visit: allergies, medications, problem list, medical history, surgical history, family history, social history, and previous encounter notes.   Weight Summary and Biometrics   Weight Lost Since Last Visit: 0lb  Weight Gained Since Last Visit: 0lb   Vitals Temp: 98.7 F (37.1 C) BP: 129/83 Pulse Rate: 67 SpO2: 97 %   Anthropometric Measurements Height: 5' 7 (1.702 m) Weight:  235 lb (106.6 kg) BMI (Calculated): 36.8 Weight at Last Visit: 237lb Weight Lost Since Last Visit: 0lb Weight Gained Since Last Visit: 0lb Starting Weight: 237lb Total Weight Loss (lbs): 2 lb (0.907 kg) Peak Weight: 240lb Waist Measurement : 51 inches   Body Composition  Body Fat %: 47.5 % Fat Mass (lbs): 111.8 lbs Muscle Mass (lbs): 117.4 lbs Total Body Water (lbs): 85.4 lbs Visceral Fat Rating : 13   Other Clinical Data RMR: [id number] Fasting: No Labs: No Today's Visit #: 2 Starting Date: 06/20/23 Comments: Cat 2 BL options     Objective:   PHYSICAL EXAM:  Blood pressure 129/83, pulse 67, temperature 98.7 F (37.1 C), height 5' 7 (1.702 m), weight 235 lb (106.6 kg), SpO2 97%. Body mass index is 36.81 kg/m.  General: she is overweight, cooperative and in no acute distress.   HEENT: EOMI, sclerae are anicteric. Lungs: Normal breathing effort, no conversational dyspnea. M-Sk:  Normal gross ROM * 4 extremities  PSYCH:  Has normal mood, affect and thought process. Neurologic: No gross sensory or motor deficits. Well developed, A and O * 3  DIAGNOSTIC DATA REVIEWED:  BMET    Component Value Date/Time   NA 139 06/20/2023 0943   K 4.6 06/20/2023 0943   CL 101 06/20/2023 0943   CO2 22 06/20/2023 0943   GLUCOSE 87 06/20/2023 0943   GLUCOSE 90 06/24/2014 1928   BUN 17 06/20/2023 0943   CREATININE 0.82 06/20/2023 0943   CREATININE 0.84 06/24/2014 1928   CALCIUM 9.5 06/20/2023 0943   GFRNONAA 89 06/07/2016 0817   GFRAA 102 06/07/2016 0817   Lab Results  Component Value Date   HGBA1C 5.3 06/20/2023   HGBA1C 5.2 06/07/2016   Lab Results  Component Value Date   INSULIN  12.4 06/20/2023   Lab Results  Component Value Date   TSH 1.770 06/20/2023   CBC    Component Value Date/Time   WBC 4.6 06/20/2023 0943   WBC 6.7 03/22/2023 0838   RBC 4.24 06/20/2023 0943   RBC 4.06 03/22/2023 0838   HGB 13.0 06/20/2023 0943   HCT 39.8 06/20/2023 0943   PLT 220 06/20/2023 0943   MCV 94 06/20/2023 0943   MCH 30.7 06/20/2023 0943   MCH 30.5 03/22/2023 0838   MCHC 32.7 06/20/2023 0943   MCHC 32.9 03/22/2023 0838   RDW 12.1 06/20/2023 0943   Iron Studies No results found for: IRON, TIBC, FERRITIN, IRONPCTSAT Lipid Panel     Component Value Date/Time   CHOL 167 06/20/2023 0943   TRIG 76 06/20/2023 0943   HDL 52 06/20/2023 0943   CHOLHDL 3.2 06/20/2023 0943   LDLCALC 101 (H) 06/20/2023 0943   Hepatic Function Panel     Component Value Date/Time   PROT 7.0 06/20/2023 0943   ALBUMIN 4.3 06/20/2023 0943   AST 31 06/20/2023 0943   ALT 34 (H) 06/20/2023 0943   ALKPHOS 92 06/20/2023 0943   BILITOT 0.6 06/20/2023 0943      Component Value Date/Time   TSH 1.770 06/20/2023 0943   Nutritional Lab Results  Component Value Date   VD25OH 45.8 06/20/2023    Attestations:   I, Special Puri, acting as a Stage manager for Jenna Jenkins, DO., have compiled all relevant documentation for  today's office visit on behalf of Jenna Jenkins, DO, while in the presence of [employer], DO.  I have spent 56 minutes in the care of the patient today including 49 minutes of face-to-face counseling and reviewing listed  medical problems above as outlined in office visit note, providing nutritional and behavioral counseling as outlined in obesity care plan, independently interpreting results and goals of care, see listed medical problems, and discussing biometric information and progress. We reviewed her meal plan and discussed how the foods she's eating is affecting each one of her labs. Pt educated on why we want her to eat various foods in various amounts and has a better understanding of the nutritional plan because of this. All her questions were answered today.   I have reviewed the above documentation for accuracy and completeness, and I agree with the above. Jenna JINNY Chapman, D.O.  The 21st Century Cures Act was signed into law in 2016 which includes the topic of electronic health records.  This provides immediate access to information in MyChart.  This includes consultation notes, operative notes, office notes, lab results and pathology reports.  If you have any questions about what you read please let us  know at your next visit so we can discuss your concerns and take corrective action if need be.  We are right here with you.

## 2023-07-15 ENCOUNTER — Telehealth (INDEPENDENT_AMBULATORY_CARE_PROVIDER_SITE_OTHER): Admitting: Psychology

## 2023-07-15 DIAGNOSIS — F419 Anxiety disorder, unspecified: Secondary | ICD-10-CM

## 2023-07-15 DIAGNOSIS — F909 Attention-deficit hyperactivity disorder, unspecified type: Secondary | ICD-10-CM | POA: Diagnosis not present

## 2023-07-15 DIAGNOSIS — F32A Depression, unspecified: Secondary | ICD-10-CM | POA: Diagnosis not present

## 2023-07-15 DIAGNOSIS — F5089 Other specified eating disorder: Secondary | ICD-10-CM
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
## 2023-07-23 ENCOUNTER — Telehealth (INDEPENDENT_AMBULATORY_CARE_PROVIDER_SITE_OTHER): Admitting: Psychology

## 2023-07-23 DIAGNOSIS — F909 Attention-deficit hyperactivity disorder, unspecified type: Secondary | ICD-10-CM | POA: Diagnosis not present

## 2023-07-23 DIAGNOSIS — F32A Depression, unspecified: Secondary | ICD-10-CM | POA: Diagnosis not present

## 2023-07-23 DIAGNOSIS — F5089 Other specified eating disorder: Secondary | ICD-10-CM

## 2023-07-23 DIAGNOSIS — F419 Anxiety disorder, unspecified: Secondary | ICD-10-CM | POA: Diagnosis not present

## 2023-07-23 NOTE — Progress Notes (Signed)
  Office: [phone]  /  Fax: [phone]    Date: July 23, 2023  Appointment Start Time: 8:36am Duration: 49 minutes Provider: Wyatt Fire, Psy.D. Type of Session: Individual Therapy  Location of Patient: Home (private location) Location of Provider: Provider's Home (private office) Type of Contact: Telepsychological Visit via MyChart Video Visit  Session Content: Jenna Chapman is a 53 y.o. female presenting for a follow-up appointment to address the previously established treatment goal of increasing coping skills.Today's appointment was a telepsychological visit. Jenna Chapman provided verbal consent for today's telepsychological appointment and she is aware she is responsible for securing confidentiality on her end of the session. Prior to proceeding with today's appointment, Jenna Chapman's physical location at the time of this appointment was obtained as well a phone number she could be reached at in the event of technical difficulties. Jenna Chapman and this provider participated in today's telepsychological service.   This provider conducted a brief check-in. 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 Associated thoughts and feelings were explored and processed. Of note, Jenna Chapman reported she has been on edge the last few days. She described a desire to express herself but she noted worry about hurting anyone's feelings. Jenna Chapman acknowledged the aforementioned has resulted in engagement in emotional eating behaviors and identified stress as a trigger. Psychoeducation provided regarding passive, aggressive, and assertive communication. Session concluded with Jenna Chapman sharing about the playlist she made for herself as discussed during the initial appointment. Overall, Jenna Chapman was receptive to today's appointment as evidenced by openness to sharing, responsiveness to feedback, and willingness to implement discussed strategies .  Mental Status Examination:   Appearance: neat Behavior: appropriate to circumstances Mood: neutral Affect: mood congruent Speech: WNL Eye Contact: appropriate Psychomotor Activity: WNL Gait: unable to assess Thought Process: linear, logical, and goal directed and denies suicidal, homicidal, and self-harm ideation, plan and intent since the last appointment with this provider  Thought Content/Perception: no hallucinations, delusions, bizarre thinking or behavior endorsed or observed Orientation: AAOx4 Memory/Concentration: intact Insight: fair Judgment: fair  Interventions:  Conducted a brief chart review Conducted a risk assessment Provided empathic reflections and validation Provided positive reinforcement Employed supportive psychotherapy interventions to facilitate reduced distress and to improve coping skills with identified stressors Psychoeducation provided regarding communication styles  DSM-5 Diagnosis(es): F50.89 Other Specified Feeding or Eating Disorder, Emotional Eating Behaviors,F90.9 Unspecified Attention-Deficit/Hyperactivity Disorder , F41.9 Unspecified Anxiety Disorder, and  F32.A Unspecified Depressive Disorder  Treatment Goal & Progress: During the initial appointment with this provider, the following treatment goal was established: increase coping skills. Progress is limited, as Jenna Chapman has just begun treatment with this provider; however, she is receptive to the interaction and interventions and rapport is being established.   Plan: The next appointment is scheduled for 08/06/2023 at 12:30pm, which will be via MyChart Video Visit. The next session will focus on working towards the established treatment goal. Jenna Chapman will be initiating therapeutic services with [facility] on 08/13/2023. She was placed on their wait list for an ADHD evaluation.     Wyatt Fire, PsyD

## 2023-07-30 ENCOUNTER — Ambulatory Visit (HOSPITAL_COMMUNITY)
Admission: RE | Admit: 2023-07-30 | Discharge: 2023-07-30 | Disposition: A | Discharge status: Home / Self Care | Source: Ambulatory Visit | Attending: Thoracic Surgery (Cardiothoracic Vascular Surgery) | Admitting: Thoracic Surgery (Cardiothoracic Vascular Surgery)

## 2023-07-30 DIAGNOSIS — J439 Emphysema, unspecified: Secondary | ICD-10-CM | POA: Insufficient documentation

## 2023-07-30 DIAGNOSIS — I700 Atherosclerosis of aorta: Secondary | ICD-10-CM | POA: Diagnosis not present

## 2023-07-30 DIAGNOSIS — M898X8 Other specified disorders of bone, other site: Secondary | ICD-10-CM | POA: Insufficient documentation

## 2023-07-30 DIAGNOSIS — J479 Bronchiectasis, uncomplicated: Secondary | ICD-10-CM | POA: Insufficient documentation

## 2023-07-31 ENCOUNTER — Ambulatory Visit (INDEPENDENT_AMBULATORY_CARE_PROVIDER_SITE_OTHER): Admitting: Family Medicine

## 2023-07-31 ENCOUNTER — Encounter (INDEPENDENT_AMBULATORY_CARE_PROVIDER_SITE_OTHER): Payer: Self-pay | Admitting: Family Medicine

## 2023-07-31 VITALS — BP 117/78 | HR 78 | Temp 98.2°F | Ht 67.0 in | Wt 223.0 lb

## 2023-07-31 DIAGNOSIS — E88819 Insulin resistance, unspecified: Secondary | ICD-10-CM

## 2023-07-31 DIAGNOSIS — E7800 Pure hypercholesterolemia, unspecified: Secondary | ICD-10-CM | POA: Diagnosis not present

## 2023-07-31 DIAGNOSIS — E669 Obesity, unspecified: Secondary | ICD-10-CM | POA: Diagnosis not present

## 2023-07-31 DIAGNOSIS — F5089 Other specified eating disorder: Secondary | ICD-10-CM | POA: Diagnosis not present

## 2023-07-31 DIAGNOSIS — Z6834 Body mass index (BMI) 34.0-34.9, adult: Secondary | ICD-10-CM

## 2023-07-31 NOTE — Progress Notes (Signed)
 Jenna Chapman, D.O.  ABFM, ABOM Specializing in Clinical Bariatric Medicine  Office located at: [address] [place], [place]  [address]   Assessment and Plan:   Medications Discontinued During This Encounter  Medication Reason   citalopram (CELEXA) 20 MG tablet Patient Preference     FOR THE DISEASE OF OBESITY: Obesity, Beginning BMI 37.2 BMI 34.0-34.9,adult current 34.93 Assessment & Plan: Since last office visit on 07/04/23 patient's muscle mass has decreased by 1.6 lbs. Fat mass has decreased by 10.6 lbs. Total body water has decreased by 7.2lbs.  Body fat % has decreased by 2.1%. Counseling done on how various foods will affect these numbers and how to maximize success  Total lbs lost to date: 14 lbs Total weight loss percentage to date: -5.91%   Recommended Dietary Goals Jenna Chapman is currently in the action stage of change. As such, her goal is to continue weight management plan.  Reviewed recommended parameters: 1150-1250 calories and 90+ g of protein. Aim for about 200-250 calories at breakfast, 300-400 at lunch, and ~600 at dinner.    She has agreed to: continue current plan   Behavioral Intervention We discussed the following today: increasing lean protein intake to established goals, decreasing simple carbohydrates , increasing vegetables, increasing lower glycemic fruits, work on tracking and journaling calories using tracking application, identifying sources and decreasing liquid calories, decreasing eating out or consumption of processed foods, and making healthy choices when eating convenient foods, continue to practice mindfulness when eating, continue to work on maintaining a reduced calorie state, getting the recommended amount of protein, incorporating whole foods, making healthy choices, staying well hydrated and practicing mindfulness when eating., and focusing on food with a 10:1 ratio of calories: grams of protein  Additional resources provided today:   Handout on Dining Out Guide  Evidence-based interventions for health behavior change were utilized today including the discussion of self monitoring techniques, problem-solving barriers and SMART goal setting techniques.   Regarding patient's less desirable eating habits and patterns, we employed the technique of small changes.   Pt will specifically work on: measuring her protein at each meal, aiming to meet her daily protein goal    Recommended Physical Activity Goals Jenna Chapman has been advised to work up to 300-450 minutes of moderate intensity aerobic activity a week and strengthening exercises 2-3 times per week for cardiovascular health, weight loss maintenance and preservation of muscle mass.   She has agreed to: Continue current level of physical activity    Pharmacotherapy We both agreed to: Continue with current nutritional and behavioral strategies   ASSOCIATED CONDITIONS ADDRESSED TODAY:  Insulin  resistance - new onset Assessment & Plan: Lab Results  Component Value Date   HGBA1C 5.3 06/20/2023   HGBA1C 5.2 06/07/2016   INSULIN  12.4 06/20/2023    No meds currently. Taking diet and lifestyle intervention approach. Hunger and cravings are well controlled. No acute concerns.   Reviewed how protein can help with better control of hunger/cravings, stabilizing sugars, and support with insulin  sensitivity. Work on meeting her recommended protein intake goals at each meal (as documented above; under obesity dx). Decrease simple carb intake and eat fruits that have a low-glycemic index.     Other Specified Feeding or Eating Disorder, Emotional Eating Behaviors Assessment & Plan: PCP started her on Wellbutrin since her LOV and stopped Celexa. Hunger/cravings are well controlled.   Continue working on prioritizing protein at each meal and decreasing simple carbs. Reviewed the role of protein as it helps  with control of hunger/cravings/food noise. Continue with Wellbutrin as  prescribed. Will continue monitoring alongside PCP.     Elevated LDL cholesterol level Assessment & Plan: Lab Results  Component Value Date   CHOL 167 06/20/2023   HDL 52 06/20/2023   LDLCALC 101 (H) 06/20/2023   TRIG 76 06/20/2023   CHOLHDL 3.2 06/20/2023   Reviewed last obtained labs: LDL was above goal at 101. Visceral fat rating improved from 13 to 12 since she started the program.   Goal visceral fat rating for females is <12. Educated pt on pathophysiology of visceral adiposity as it relates to obesity and other chronic diseases. Discussed how shedding fat and gaining muscle with healthy diet and exercise can help improve condition. Continue working on increasing protein intake, reducing consumption of saturated and trans fats, and decrease simple carb intake.     Follow up:   Return for f/u in 3 weeks x2. She was informed of the importance of frequent follow up visits to maximize her success with intensive lifestyle modifications for her multiple health conditions.  Subjective:   Chief complaint: Obesity Jenna Chapman is here to discuss her progress with her obesity treatment plan. She is on the Category 2 Plan and states she is following her eating plan approximately 75% of the time. She states she is swimming 3 hours 4 days per week.   Interval History:  Jenna Chapman is here for a follow up office visit. Since last OV on 07/04/23, she is down 12 lbs. Has been working on portioning her meals, eating all her food at each meal, and meeting her daily protein intake goal each day. Has been drinking sugary caloric beverages. Hunger/cravings are well controlled. Recalls eating McDonald[employer] once and felt poorly afterwards. For breakfast, she tends to eat 2 scrambled eggs and [place] bacon and tomatoes, even when eating out. When eating out, she has opted for healthier options (ex: grilled chicken and kale salad instead of fries). She recently went on vacation and reports increasing her  exercise; swam for more time than she usually does.    Pharmacotherapy that aid with weight loss: She is currently taking Wellbutrin XL 150 mg once daily.    Review of Systems:  Pertinent positives were addressed with patient today.  Reviewed by clinician on day of visit: allergies, medications, problem list, medical history, surgical history, family history, social history, and previous encounter notes.  Weight Summary and Biometrics   Weight Lost Since Last Visit: 12lb  Weight Gained Since Last Visit: 0lb    Vitals Temp: 98.2 F (36.8 C) BP: 117/78 Pulse Rate: 78 SpO2: 97 %   Anthropometric Measurements Height: 5' 7 (1.702 m) Weight: 223 lb (101.2 kg) BMI (Calculated): 34.92 Weight at Last Visit: 235lb Weight Lost Since Last Visit: 12lb Weight Gained Since Last Visit: 0lb Starting Weight: 237lb Total Weight Loss (lbs): 14 lb (6.35 kg) Peak Weight: 240lb Waist Measurement : 51 inches   Body Composition  Body Fat %: 45.4 % Fat Mass (lbs): 101.6 lbs Muscle Mass (lbs): 115.8 lbs Total Body Water (lbs): 78.2 lbs Visceral Fat Rating : 12   Other Clinical Data RMR: [id number] Fasting: No Labs: no Today's Visit #: 3 Starting Date: 06/20/23 Comments: Cat 2 BL option    Objective:   PHYSICAL EXAM: Blood pressure 117/78, pulse 78, temperature 98.2 F (36.8 C), height 5' 7 (1.702 m), weight 223 lb (101.2 kg), SpO2 97%. Body mass index is 34.93 kg/m.  General: she is overweight, cooperative and in  no acute distress. PSYCH: Has normal mood, affect and thought process.   HEENT: EOMI, sclerae are anicteric. Lungs: Normal breathing effort, no conversational dyspnea. Extremities: Moves * 4 Neurologic: A and O * 3, good insight  DIAGNOSTIC DATA REVIEWED: BMET    Component Value Date/Time   NA 139 06/20/2023 0943   K 4.6 06/20/2023 0943   CL 101 06/20/2023 0943   CO2 22 06/20/2023 0943   GLUCOSE 87 06/20/2023 0943   GLUCOSE 90 06/24/2014 1928   BUN 17  06/20/2023 0943   CREATININE 0.82 06/20/2023 0943   CREATININE 0.84 06/24/2014 1928   CALCIUM 9.5 06/20/2023 0943   GFRNONAA 89 06/07/2016 0817   GFRAA 102 06/07/2016 0817   Lab Results  Component Value Date   HGBA1C 5.3 06/20/2023   HGBA1C 5.2 06/07/2016   Lab Results  Component Value Date   INSULIN  12.4 06/20/2023   Lab Results  Component Value Date   TSH 1.770 06/20/2023   CBC    Component Value Date/Time   WBC 4.6 06/20/2023 0943   WBC 6.7 03/22/2023 0838   RBC 4.24 06/20/2023 0943   RBC 4.06 03/22/2023 0838   HGB 13.0 06/20/2023 0943   HCT 39.8 06/20/2023 0943   PLT 220 06/20/2023 0943   MCV 94 06/20/2023 0943   MCH 30.7 06/20/2023 0943   MCH 30.5 03/22/2023 0838   MCHC 32.7 06/20/2023 0943   MCHC 32.9 03/22/2023 0838   RDW 12.1 06/20/2023 0943   Iron Studies No results found for: IRON, TIBC, FERRITIN, IRONPCTSAT Lipid Panel     Component Value Date/Time   CHOL 167 06/20/2023 0943   TRIG 76 06/20/2023 0943   HDL 52 06/20/2023 0943   CHOLHDL 3.2 06/20/2023 0943   LDLCALC 101 (H) 06/20/2023 0943   Hepatic Function Panel     Component Value Date/Time   PROT 7.0 06/20/2023 0943   ALBUMIN 4.3 06/20/2023 0943   AST 31 06/20/2023 0943   ALT 34 (H) 06/20/2023 0943   ALKPHOS 92 06/20/2023 0943   BILITOT 0.6 06/20/2023 0943      Component Value Date/Time   TSH 1.770 06/20/2023 0943   Nutritional Lab Results  Component Value Date   VD25OH 45.8 06/20/2023    Attestations:   LILLETTE Vernell [name], acting as a medical scribe for Jenna Jenkins, DO., have compiled all relevant documentation for today's office visit on behalf of Jenna Jenkins, DO, while in the presence of [employer], DO.  I have reviewed the above documentation for accuracy and completeness, and I agree with the above. Jenna JINNY Chapman, D.O.  The 21st Century Cures Act was signed into law in 2016 which includes the topic of electronic health records.  This provides immediate  access to information in MyChart.  This includes consultation notes, operative notes, office notes, lab results and pathology reports.  If you have any questions about what you read please let us  know at your next visit so we can discuss your concerns and take corrective action if need be.  We are right here with you.

## 2023-08-06 ENCOUNTER — Ambulatory Visit
Attending: Thoracic Surgery (Cardiothoracic Vascular Surgery) | Admitting: Thoracic Surgery (Cardiothoracic Vascular Surgery)

## 2023-08-06 ENCOUNTER — Telehealth (INDEPENDENT_AMBULATORY_CARE_PROVIDER_SITE_OTHER): Admitting: Psychology

## 2023-08-06 VITALS — BP 130/82 | HR 88 | Resp 18 | Ht 67.0 in | Wt 230.0 lb

## 2023-08-06 DIAGNOSIS — F419 Anxiety disorder, unspecified: Secondary | ICD-10-CM

## 2023-08-06 DIAGNOSIS — F32A Depression, unspecified: Secondary | ICD-10-CM | POA: Diagnosis not present

## 2023-08-06 DIAGNOSIS — F5089 Other specified eating disorder: Secondary | ICD-10-CM

## 2023-08-06 DIAGNOSIS — M898X8 Other specified disorders of bone, other site: Secondary | ICD-10-CM

## 2023-08-06 DIAGNOSIS — F909 Attention-deficit hyperactivity disorder, unspecified type: Secondary | ICD-10-CM | POA: Diagnosis not present

## 2023-08-06 NOTE — Progress Notes (Signed)
  Office: [phone]  /  Fax: [phone]    Date: August 06, 2023  Appointment Start Time: 12:29pm Duration: 30 minutes Provider: Wyatt Fire, Psy.D. Type of Session: Individual Therapy  Location of Patient: Home (private location) Location of Provider: Provider's Home (private office) Type of Contact: Telepsychological Visit via MyChart Video Visit  Session Content: Jenna Chapman is a 53 y.o. female presenting for a follow-up appointment to address the previously established treatment goal of increasing coping skills.Today's appointment was a telepsychological visit. [name] provided verbal consent for today's telepsychological appointment and she is aware she is responsible for securing confidentiality on her end of the session. Prior to proceeding with today's appointment, [name]'s physical location at the time of this appointment was obtained as well a phone number she could be reached at in the event of technical difficulties. Bascom and this provider participated in today's telepsychological service.   This provider conducted a brief check-in. [name] stated she lost 14 pounds, but other scales suggest weight gain. Further explored and discussed ceasing weighing herself at home. She further discussed an improvement in communication utilizing skills obtained from the last appointment with her husband, but continues to express concern. Couples counseling was discussed and recommended. [name] provided verbal consent for this provider to e-mail referral options. Remainder of today's appointment focused on discussing the dieting mentality versus lifestyle change, as [name] acknowledged it is challenging to consider weight loss to date as a success. Overall, [name] was receptive to today's appointment as evidenced by openness to sharing, responsiveness to feedback, and willingness to implement discussed strategies .  Mental Status Examination:  Appearance: neat Behavior: appropriate to circumstances Mood:  sad Affect: mood congruent Speech: WNL Eye Contact: appropriate Psychomotor Activity: WNL Gait: unable to assess Thought Process: linear, logical, and goal directed and denies suicidal, homicidal, and self-harm ideation, plan and intent since the last appointment with this provider  Thought Content/Perception: no hallucinations, delusions, bizarre thinking or behavior endorsed or observed Orientation: AAOx4 Memory/Concentration: intact Insight: fair Judgment: fair  Interventions:  Conducted a brief chart review Conducted a risk assessment Provided empathic reflections and validation Reviewed content from the previous session Provided positive reinforcement Employed supportive psychotherapy interventions to facilitate reduced distress and to improve coping skills with identified stressors Psychoeducation provided regarding dieting mentality vs. lifestyle change Recommended couple's counseling   DSM-5 Diagnosis(es): F50.89 Other Specified Feeding or Eating Disorder, Emotional Eating Behaviors,F90.9 Unspecified Attention-Deficit/Hyperactivity Disorder, F41.9 Unspecified Anxiety Disorder, and  F32.A Unspecified Depressive Disorder  Treatment Goal & Progress: During the initial appointment with this provider, the following treatment goal was established: increase coping skills. [name] has demonstrated progress in her goal as evidenced by increased awareness of hunger patterns. [name] also continues to demonstrate willingness to engage in learned skill(s).  Plan: The next appointment is scheduled for 08/26/2023 at 11am, which will be via MyChart Video Visit. The next session will focus on working towards the established treatment goal. [name] will be initiating therapeutic services with [facility] on 08/13/2023. She was placed on their wait list for an ADHD evaluation. This provider will e-mail referral options for couple's counseling.      Wyatt Fire, PsyD

## 2023-08-06 NOTE — Progress Notes (Signed)
 [address], [place]enna Chapman             [phone]     HPI: Ms. Burkholder returns for follow-up regarding a sternal mass.  Jenna Chapman is a 53 year old former smoker with a history of cervical cancer, migraines, subdural hematoma, and a sternal mass.  She underwent a low-dose CT for lung cancer screening because of her smoking history.  She was noted to have an expansile lesion in the mid sternum.  There were no suspicious pulmonary nodules.  She had a CT-guided needle biopsy, which was consistent with an osteochondroma.  He was offered the option of surgical resection versus radiographic observation.  She did not wish to undergo surgery.  Last saw her in April.  Since then she continues to occasionally notice some mild pain in that area.  Also complains of sometimes feeling short of breath with exertion.  She does use an inhaler.  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     buPROPion (WELLBUTRIN XL) 150 MG 24 hr tablet Take 150 mg by mouth daily.     estradiol (ESTRACE) 2 MG tablet Take 2 mg by mouth daily.  11   methenamine (HIPREX) 1 g tablet Take 1 g by mouth 2 (two) times daily with a meal.     Tiotropium Bromide-Olodaterol (STIOLTO RESPIMAT ) 2.5-2.5 MCG/ACT AERS Inhale 2 puffs into the lungs daily. 1 each 5   No current facility-administered medications for this visit.    Physical Exam BP 130/82   Pulse 88   Resp 18   Ht 5' 7 (1.702 m)   Wt 230 lb (104.3 kg)   SpO2 98%   BMI 36.10 kg/m  53 year old woman in no acute distress Alert and oriented x 3 with no focal deficits Mild tenderness to palpation over  sternal mass.  Difficult to feel distinct margins of mass due to body habitus.  Diagnostic Tests: CT CHEST WITHOUT CONTRAST   TECHNIQUE: Multidetector CT imaging of the chest was performed following the standard protocol without IV contrast.   RADIATION DOSE REDUCTION: This exam was performed according to the departmental dose-optimization program which includes automated exposure control, adjustment of the mA and/or kV according to patient size and/or use of iterative reconstruction technique.   COMPARISON:  11/08/2022, 03/22/2023.   FINDINGS: Cardiovascular: The heart is normal in size and there is no pericardial effusion. There is atherosclerotic calcification of the aorta without evidence of aneurysm. The pulmonary trunk is normal in caliber.   Mediastinum/Nodes: No mediastinal or axillary lymphadenopathy. Evaluation of the hila is limited due to lack of IV contrast. The thyroid gland, trachea, and esophagus are within normal limits.   Lungs/Pleura: Mild pleuroparenchymal scarring is noted at the lung apices. Mild paraseptal and centrilobular emphysematous changes are present in the lungs. Bronchiectasis is present bilaterally. Scattered airspace opacities are noted in the lungs bilaterally involving the bilateral upper lobes and right lower lobe. There is consolidation in the right lower lobe. No effusion or pneumothorax is seen.   Upper Abdomen: No acute abnormality.   Musculoskeletal:  Degenerative changes are present in the thoracic spine. There is a complex expansile mixed lytic and sclerotic lesion in the mid sternal body measuring 3.4 x 2.4 x 1.5 cm, not significantly changed when differences in technique are taken into consideration. No acute fracture, erosions, soft tissue component or fat stranding is seen.   IMPRESSION: 1. Essentially stable mixed lytic and sclerotic expansile lesion in the sternal body. Please refer to prior biopsy results and  follow-up per recommendations. 2. Patchy airspace disease in the lungs bilaterally with right lower lobe consolidation, concerning for pneumonia. 3. Emphysema. 4. Aortic atherosclerosis.     Electronically Signed   By: Leita Birmingham M.D.   On: 08/04/2023 15:33 I personally reviewed the CT images and compared them to her films from November 2024.  Measured slightly smaller but due to technical issues felt to be unchanged by radiology.  No suspicious pulmonary nodules.  Impression: Jenna Chapman is a 53 year old former smoker with a history of cervical cancer, migraines, subdural hematoma, and a sternal mass.   Sternal mass-unchanged over 8 months.  Previous biopsy showed benign osteochondroma.  I again offered the option of surgical resection versus continue radiographic observation.  She does have some pain in that area from time to time, but it is not significant enough for her to want to undergo surgery.  I emphasized that although radiographic features and pathology suggest this is benign we have not completely ruled out the possibility of a more aggressive lesion and needs continued follow-up.  Dyspnea with exertion-she does have some signs of emphysema and a smoking history.  She has an albuterol  inhaler.  Recommend that she follow-up with her primary care regarding this issues.  History of tobacco abuse-90-pack-year history of smoking.  Quit in 2017.  Plan: Return in 6 months with CT chest Follow-up with primary regarding shortness of breath  Jenna JAYSON Millers, MD [facility] [phone]

## 2023-08-13 ENCOUNTER — Ambulatory Visit: Admitting: Licensed Clinical Social Worker

## 2023-08-13 DIAGNOSIS — F411 Generalized anxiety disorder: Secondary | ICD-10-CM | POA: Diagnosis not present

## 2023-08-13 DIAGNOSIS — F331 Major depressive disorder, recurrent, moderate: Secondary | ICD-10-CM | POA: Insufficient documentation
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
## 2023-08-15 ENCOUNTER — Ambulatory Visit: Admitting: Internal Medicine

## 2023-08-15 DIAGNOSIS — J439 Emphysema, unspecified: Secondary | ICD-10-CM | POA: Diagnosis not present

## 2023-08-15 DIAGNOSIS — J4489 Other specified chronic obstructive pulmonary disease: Secondary | ICD-10-CM

## 2023-08-15 LAB — PULMONARY FUNCTION TEST
DL/VA % pred: 91 %
DL/VA: 3.84 ml/min/mmHg/L
DLCO cor % pred: 76 %
DLCO cor: 17.53 ml/min/mmHg
DLCO unc % pred: 75 %
DLCO unc: 17.31 ml/min/mmHg
FEF 25-75 Pre: 2.17 L/s
FEF2575-%Pred-Pre: 78 %
FEV1-%Change-Post: -24 %
FEV1-%Pred-Post: 59 %
FEV1-%Pred-Pre: 78 %
FEV1-Post: 1.78 L
FEV1-Pre: 2.37 L
FEV1FVC-%Change-Post: 2 %
FEV1FVC-%Pred-Pre: 98 %
FEV6-%Change-Post: -26 %
FEV6-%Pred-Post: 59 %
FEV6-%Pred-Pre: 80 %
FEV6-Post: 2.2 L
FEV6-Pre: 3.01 L
FEV6FVC-%Change-Post: 0 %
FEV6FVC-%Pred-Post: 103 %
FEV6FVC-%Pred-Pre: 102 %
FVC-%Change-Post: -26 %
FVC-%Pred-Post: 57 %
FVC-%Pred-Pre: 78 %
FVC-Post: 2.22 L
FVC-Pre: 3.02 L
Post FEV1/FVC ratio: 80 %
Post FEV6/FVC ratio: 100 %
Pre FEV1/FVC ratio: 78 %
Pre FEV6/FVC Ratio: 99 %
RV % pred: 109 %
RV: 2.21 L
TLC % pred: 95 %
TLC: 5.26 L

## 2023-08-15 NOTE — Patient Instructions (Signed)
 Full pft performed today

## 2023-08-15 NOTE — Progress Notes (Signed)
 Full pft performed today

## 2023-08-16 ENCOUNTER — Ambulatory Visit: Admitting: Clinical

## 2023-08-19 ENCOUNTER — Encounter: Payer: Self-pay | Admitting: Internal Medicine

## 2023-08-19 ENCOUNTER — Ambulatory Visit: Admitting: Internal Medicine

## 2023-08-19 ENCOUNTER — Encounter

## 2023-08-19 VITALS — BP 122/82 | HR 69 | Temp 97.9°F | Ht 67.0 in | Wt 230.0 lb

## 2023-08-19 DIAGNOSIS — Z87891 Personal history of nicotine dependence: Secondary | ICD-10-CM | POA: Diagnosis not present

## 2023-08-19 DIAGNOSIS — D169 Benign neoplasm of bone and articular cartilage, unspecified: Secondary | ICD-10-CM

## 2023-08-19 DIAGNOSIS — R0602 Shortness of breath: Secondary | ICD-10-CM

## 2023-08-19 DIAGNOSIS — G4733 Obstructive sleep apnea (adult) (pediatric): Secondary | ICD-10-CM

## 2023-08-19 MED ORDER — STIOLTO RESPIMAT 2.5-2.5 MCG/ACT IN AERS: 2.0000 | INHALATION_SPRAY | Freq: Every day | RESPIRATORY_TRACT | 11 refills | Status: AC

## 2023-08-19 MED ORDER — ALBUTEROL SULFATE HFA 108 (90 BASE) MCG/ACT IN AERS: 2.0000 | INHALATION_SPRAY | Freq: Four times a day (QID) | RESPIRATORY_TRACT | 11 refills | Status: AC | PRN

## 2023-08-19 NOTE — Progress Notes (Signed)
 Jenna Chapman    [id number]    Oct 01, 1970  Primary Care Physician:Vanstory, Rosina SAILOR, PA Date of Appointment: 08/19/2023 Established Patient Visit  Chief complaint:   Chief Complaint  Patient presents with   COPD    Breathing has gotten worse.     HPI: Jenna Chapman is a 53 y.o. woman with history of tobacco use disorder, quit 2017 and Benign sternal chondroma.   Interval Updates: Here for follow up after PFTS. Feels albuterol  helped her during the test.   Stiolto helping.  Still gets dyspnea while working at [employer]. No wheeze No chest pain, palpitations, no coughing or mucus production.   No fevers, chills.   Having daytime sleepiness. Has seen sleep medicine and is having in lab study in September.  I have reviewed the patient's family social and past medical history and updated as appropriate.   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/02/2023    Past Surgical History:  Procedure Laterality Date   ABDOMINAL HYSTERECTOMY      Family History  Problem Relation Age of Onset   Depression Mother    Hyperlipidemia Mother    Hypertension Mother    High Cholesterol Mother    High blood pressure Mother    Lung cancer Mother        06-15-14 diagnosed   COPD Mother     Social History   Occupational History   Occupation: KEY Set designer: [employer]    Comment: [employer]  Tobacco Use   Smoking status: Former    Current packs/day: 0.00    Average packs/day: 3.0 packs/day for 32.0 years (96.0 ttl pk-yrs)    Types: Cigarettes    Start date: 31    Quit date: 2017    Years since quitting: 8.6   Smokeless tobacco: Never   Tobacco comments:    Quit in May 2017  Substance and Sexual Activity   Alcohol use: No    Alcohol/week: 0.0 standard drinks of alcohol   Drug use: No   Sexual  activity: Not on file     Physical Exam: Blood pressure 122/82, pulse 69, temperature 97.9 F (36.6 C), temperature source Oral, height 5' 7 (1.702 m), weight 230 lb (104.3 kg), SpO2 99%.  Gen:      No acute distress ENT:  no nasal polyps, mucus membranes moist Lungs:    No increased respiratory effort, symmetric chest wall excursion, clear to auscultation bilaterally, no wheezes or crackles CV:         Regular rate and rhythm; no murmurs, rubs, or gallops.  No pedal edema   Data Reviewed: Imaging: I have personally reviewed the ct chest in August 2025 - emphysema, stable sternal lesion. Mild ground glass in the RLL, likely infectious/inflammatory  PFTs:     Latest Ref Rng & Units 08/15/2023    8:31 AM  PFT Results  FVC-Pre L 3.02  P  FVC-Predicted Pre % 78  P  FVC-Post L 2.22  P  FVC-Predicted Post % 57  P  Pre FEV1/FVC % % 78  P  Post FEV1/FCV % % 80  P  FEV1-Pre L 2.37  P  FEV1-Predicted Pre % 78  P  FEV1-Post L 1.78  P  DLCO  uncorrected ml/min/mmHg 17.31  P  DLCO UNC% % 75  P  DLCO corrected ml/min/mmHg 17.53  P  DLCO COR %Predicted % 76  P  DLVA Predicted % 91  P  TLC L 5.26  P  TLC % Predicted % 95  P  RV % Predicted % 109  P    P Preliminary result   I have personally reviewed the patient's PFTs and normal pulmonary function testing  Labs: Lab Results  Component Value Date   NA 139 06/20/2023   K 4.6 06/20/2023   CO2 22 06/20/2023   GLUCOSE 87 06/20/2023   BUN 17 06/20/2023   CREATININE 0.82 06/20/2023   CALCIUM 9.5 06/20/2023   EGFR 85 06/20/2023   GFRNONAA 89 06/07/2016   Lab Results  Component Value Date   WBC 4.6 06/20/2023   HGB 13.0 06/20/2023   HCT 39.8 06/20/2023   MCV 94 06/20/2023   PLT 220 06/20/2023    Immunization status: Immunization History  Administered Date(s) Administered   Influenza,inj,Quad PF,6+ Mos 01/05/2016    External Records Personally Reviewed: thoracic surgery  Assessment:  Shortness of breath History of  tobacco use disorder, quit 2017 Benign Sternal Chondroma Abnormal CT Chest, 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  I still think your shortness of breath is likely secondary to some smoking-related lung disease that is mild.  I want you to continue Stiolto 2 puffs daily.  We are adding albuterol  inhaler to use on an as-needed basis up to 4 times a day  Take the albuterol  rescue inhaler every 4 to 6 hours as needed for wheezing or shortness of breath. You can also take it 15 minutes before exercise or exertional activity. Side effects include heart racing or pounding, jitters or anxiety. If you have a history of an irregular heart rhythm, it can make this worse. Can also give some patients a hard time sleeping.  I want you to start exercising.  Start simple with walking 20 minutes a day.  Can also start lifting weights.  Agree with following up for sleep apnea workup.  Seeing sleep medicine and sleep study is in lab in September.   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 She is currently asymptomatic. No indication for abx at this time. Unable to bring up mucus.   Follow up with primary care about the aortic atherosclerosis.    Return to Care: Return in about 6 months (around 02/19/2024).   Verdon Gore, MD Pulmonary and Critical Care Medicine [facility] Office:[phone]

## 2023-08-19 NOTE — Patient Instructions (Addendum)
 It was a pleasure to see you today!  Please schedule follow up with myself in 6 months.  If my schedule is not open yet, we will contact you with a reminder closer to that time. Please call [phone] if you haven't heard from us  a month before, and always call us  sooner if issues or concerns arise. You can also send us  a message through MyChart, but but aware that this is not to be used for urgent issues and it may take up to 5-7 days to receive a reply. Please be aware that you will likely be able to view your results before I have a chance to respond to them. Please give us  5 business days to respond to any non-urgent results.   Your breathing testing shows normal pulmonary function.  I still think your shortness of breath is likely secondary to some smoking-related lung disease that is mild.  I want you to continue Stiolto 2 puffs daily.  We are adding albuterol  inhaler to use on an as-needed basis up to 4 times a day  Take the albuterol  rescue inhaler every 4 to 6 hours as needed for wheezing or shortness of breath. You can also take it 15 minutes before exercise or exertional activity. Side effects include heart racing or pounding, jitters or anxiety. If you have a history of an irregular heart rhythm, it can make this worse. Can also give some patients a hard time sleeping.  I want you to start exercising.  Start simple with walking 20 minutes a day.  Can also start lifting weights.  Agree with following up for sleep apnea workup.

## 2023-08-22 ENCOUNTER — Ambulatory Visit (INDEPENDENT_AMBULATORY_CARE_PROVIDER_SITE_OTHER): Admitting: Family Medicine

## 2023-08-22 ENCOUNTER — Encounter (INDEPENDENT_AMBULATORY_CARE_PROVIDER_SITE_OTHER): Payer: Self-pay | Admitting: Family Medicine

## 2023-08-22 VITALS — BP 119/83 | HR 75 | Temp 98.2°F | Ht 67.0 in | Wt 220.0 lb

## 2023-08-22 DIAGNOSIS — F5089 Other specified eating disorder: Secondary | ICD-10-CM

## 2023-08-22 DIAGNOSIS — Z6834 Body mass index (BMI) 34.0-34.9, adult: Secondary | ICD-10-CM

## 2023-08-22 DIAGNOSIS — E669 Obesity, unspecified: Secondary | ICD-10-CM

## 2023-08-22 DIAGNOSIS — E88819 Insulin resistance, unspecified: Secondary | ICD-10-CM | POA: Diagnosis not present

## 2023-08-22 NOTE — Progress Notes (Signed)
 Jenna Chapman, D.O.  ABFM, ABOM Specializing in Clinical Bariatric Medicine  Office located at: [address] [place], [place]  [address]   Assessment and Plan:    Medications Discontinued During This Encounter  Medication Reason   buPROPion (WELLBUTRIN XL) 150 MG 24 hr tablet      FOR THE DISEASE OF OBESITY:  BMI 34.0-34.9,adult current 34.93 Obesity, Beginning BMI 37.2 Assessment & Plan: Since last office visit on 07/31/23 patient's muscle mass has decreased by 2 lbs. Fat mass has decreased by 1.4 lbs. Total body water has increased by 2.2 lbs.  Body fat % has increased by 0.1  %. Counseling done on how various foods will affect these numbers and how to maximize success  Total lbs lost to date: - 20 lbs Total weight loss percentage to date: - 8.33 %   Recommended Dietary Goals Jenna Chapman is currently in the action stage of change. As such, her goal is to continue weight management plan.  She has agreed to: continue current plan   Behavioral Intervention We discussed the following today: net-carbs, powdered peanut butter, high protein pasta, increasing lean protein intake to established goals, increasing lower glycemic fruits, increasing fiber rich foods, avoiding skipping meals, eating more regularly throughout the day, focusing on food with a 10:1 ratio of calories: grams of protein,  using GPT or another AI platform for recipe ideas- searching low calorie, low carb, high protein chicken recipes etc, and staying on track when eating out.   Additional resources provided today: Handout on slow cooker recipes, Handout on Chat-GPT inspired recipes.  Evidence-based interventions for health behavior change were utilized today including the discussion of self monitoring techniques, problem-solving barriers and SMART goal setting techniques.   Regarding patient's less desirable eating habits and patterns, we employed the technique of small changes.   Goal:  n/a   Recommended Physical Activity Goals Jenna Chapman has been advised to work up to 300-450 minutes of moderate intensity aerobic activity a week and strengthening exercises 2-3 times per week for cardiovascular health, weight loss maintenance and preservation of muscle mass.   She may continue to gradually increase the amount and intensity of exercise routine   Pharmacotherapy Continue same regimen.    ASSOCIATED CONDITIONS ADDRESSED TODAY:   Insulin  resistance Assessment & Plan: Lab Results  Component Value Date   HGBA1C 5.3 06/20/2023   HGBA1C 5.2 06/07/2016   INSULIN  12.4 06/20/2023    I.R is managed with diet and exercise. States her hunger and cravings are better controlled when she increases her lean protein intake. Educated patient that having adequate amounts of protein with each meal is important for increasing muscle mass, stabilizing sugars + insulin  levels , controlling hunger and cravings, and improving thermogenesis. She will continue all nutritional and behavioral strategies.     Other Specified Feeding or Eating Disorder, Emotional Eating Behaviors Assessment & Plan: States her Wellbutrin XL was increased to 300 mg daily by her PCP around 08/14/23. States her mood is better controlled, specifically her anxiety has decreased. Denies SI/HI. She is also working with Dr.Barker for emotional eating strategies and increased mindfulness which she is finding helpful. She will continue all treatment above as well as her prudent nutritional plan.     Follow up:   Return 09/26/2023 3:40 PM.  She was informed of the importance of frequent follow up visits to maximize her success with intensive lifestyle modifications for her multiple health conditions.   Subjective:   Chief complaint: Obesity Jenna Chapman  is here to discuss her progress with her obesity treatment plan. She is on the Category 2 Plan and states she is following her eating plan approximately 75% of the time. She states  she is weigh lifting 15-30 minutes 5 days per week    Interval History:  Jenna Chapman Jenna Chapman is here for a follow up office visit. Since last OV on 07/31/2023 , she is down 3 lbs. She states that she is skipping breakfast at times and overall endorses not getting in all her meal plan foods. Off scale win: she is measuring her proteins on a regular basis. She requests different sources of proteins apart from chicken.    Pharmacotherapy that aid with weight loss: She is currently taking Wellbutrin XL 300 mg daily.    Review of Systems:  Pertinent positives were addressed with patient today.  Reviewed by clinician on day of visit: allergies, medications, problem list, medical history, surgical history, family history, social history, and previous encounter notes.  Weight Summary and Biometrics   Weight Lost Since Last Visit: 3 lb  Weight Gained Since Last Visit: 0   Vitals Temp: 98.2 F (36.8 C) BP: 119/83 Pulse Rate: 75 SpO2: 97 %   Anthropometric Measurements Height: 5' 7 (1.702 m) Weight: 220 lb (99.8 kg) BMI (Calculated): 34.45 Weight at Last Visit: 223 lb Weight Lost Since Last Visit: 3 lb Weight Gained Since Last Visit: 0 Starting Weight: 237 lb Total Weight Loss (lbs): 17 lb (7.711 kg) Peak Weight: 240 lb Waist Measurement : 51 inches   Body Composition  Body Fat %: 45.5 % Fat Mass (lbs): 100.2 lbs Muscle Mass (lbs): 113.8 lbs Total Body Water (lbs): 80.4 lbs Visceral Fat Rating : 12   Other Clinical Data RMR: [id number] Labs: no Today's Visit #: no Starting Date: 06/20/23    Objective:   PHYSICAL EXAM: Blood pressure 119/83, pulse 75, temperature 98.2 F (36.8 C), height 5' 7 (1.702 m), weight 220 lb (99.8 kg), SpO2 97%. Body mass index is 34.46 kg/m.  General: she is overweight, cooperative and in no acute distress. PSYCH: Has normal mood, affect and thought process.   HEENT: EOMI, sclerae are anicteric. Lungs: Normal breathing effort, no  conversational dyspnea. Extremities: Moves * 4 Neurologic: A and O * 3, good insight  DIAGNOSTIC DATA REVIEWED: BMET    Component Value Date/Time   NA 139 06/20/2023 0943   K 4.6 06/20/2023 0943   CL 101 06/20/2023 0943   CO2 22 06/20/2023 0943   GLUCOSE 87 06/20/2023 0943   GLUCOSE 90 06/24/2014 1928   BUN 17 06/20/2023 0943   CREATININE 0.82 06/20/2023 0943   CREATININE 0.84 06/24/2014 1928   CALCIUM 9.5 06/20/2023 0943   GFRNONAA 89 06/07/2016 0817   GFRAA 102 06/07/2016 0817   Lab Results  Component Value Date   HGBA1C 5.3 06/20/2023   HGBA1C 5.2 06/07/2016   Lab Results  Component Value Date   INSULIN  12.4 06/20/2023   Lab Results  Component Value Date   TSH 1.770 06/20/2023   CBC    Component Value Date/Time   WBC 4.6 06/20/2023 0943   WBC 6.7 03/22/2023 0838   RBC 4.24 06/20/2023 0943   RBC 4.06 03/22/2023 0838   HGB 13.0 06/20/2023 0943   HCT 39.8 06/20/2023 0943   PLT 220 06/20/2023 0943   MCV 94 06/20/2023 0943   MCH 30.7 06/20/2023 0943   MCH 30.5 03/22/2023 0838   MCHC 32.7 06/20/2023 0943   MCHC 32.9 03/22/2023 0838  RDW 12.1 06/20/2023 0943   Iron Studies No results found for: IRON, TIBC, FERRITIN, IRONPCTSAT Lipid Panel     Component Value Date/Time   CHOL 167 06/20/2023 0943   TRIG 76 06/20/2023 0943   HDL 52 06/20/2023 0943   CHOLHDL 3.2 06/20/2023 0943   LDLCALC 101 (H) 06/20/2023 0943   Hepatic Function Panel     Component Value Date/Time   PROT 7.0 06/20/2023 0943   ALBUMIN 4.3 06/20/2023 0943   AST 31 06/20/2023 0943   ALT 34 (H) 06/20/2023 0943   ALKPHOS 92 06/20/2023 0943   BILITOT 0.6 06/20/2023 0943      Component Value Date/Time   TSH 1.770 06/20/2023 0943   Nutritional Lab Results  Component Value Date   VD25OH 45.8 06/20/2023    Attestations:   I, Special Puri, acting as a Stage manager for Jenna Jenkins, DO., have compiled all relevant documentation for today's office visit on behalf of  Jenna Jenkins, DO, while in the presence of [employer], DO.  I have spent 34 minutes in the care of the patient today including 28 minutes face-to-face assessing and reviewing listed medical problems above as outlined in office visit note and providing nutritional and behavioral counseling as outlined in obesity care plan.   I have reviewed the above documentation for accuracy and completeness, and I agree with the above. Jenna JINNY Chapman, D.O.  The 21st Century Cures Act was signed into law in 2016 which includes the topic of electronic health records.  This provides immediate access to information in MyChart.  This includes consultation notes, operative notes, office notes, lab results and pathology reports.  If you have any questions about what you read please let us  know at your next visit so we can discuss your concerns and take corrective action if need be.  We are right here with you.

## 2023-08-26 ENCOUNTER — Telehealth (INDEPENDENT_AMBULATORY_CARE_PROVIDER_SITE_OTHER): Admitting: Psychology

## 2023-08-26 DIAGNOSIS — F419 Anxiety disorder, unspecified: Secondary | ICD-10-CM | POA: Diagnosis not present

## 2023-08-26 DIAGNOSIS — F909 Attention-deficit hyperactivity disorder, unspecified type: Secondary | ICD-10-CM | POA: Diagnosis not present

## 2023-08-26 DIAGNOSIS — F32A Depression, unspecified: Secondary | ICD-10-CM

## 2023-08-26 DIAGNOSIS — F5089 Other specified eating disorder: Secondary | ICD-10-CM | POA: Diagnosis not present

## 2023-08-26 NOTE — Progress Notes (Signed)
 Office: [phone]  /  Fax: [phone]    Date: August 26, 2023  Appointment Start Time: 11:00am Duration: 28 minutes Provider: Wyatt Fire, Psy.D. Type of Session: Individual Therapy  Location of Patient: Home (private location) Location of Provider: Provider's Home (private office) Type of Contact: Telepsychological Visit via MyChart Video Visit  Session Content: Jenna Chapman is a 53 y.o. female presenting for a follow-up appointment to address the previously established treatment goal of increasing coping skills.Today's appointment was a telepsychological visit. Jenna Chapman provided verbal consent for today's telepsychological appointment and she is aware she is responsible for securing confidentiality on her end of the session. Prior to proceeding with today's appointment, Jenna Chapman's physical location at the time of this appointment was obtained as well a phone number she could be reached at in the event of technical difficulties. Jenna Chapman and this provider participated in today's telepsychological service.   This provider conducted a brief check-in. Jenna Chapman shared she is busy at work. She also discussed initiating therapeutic services with [facility] and she continues to lose weight. Notably, she described a feeling she is going to break down, but she is unsure why. Further explored and processed. She acknowledged she did not eat all today, but agreed to consume a piece of cheese during this appointment which she was observed eating. She further identified experiencing marital conflict last night. Given recent stressors/events and impact on eating habits, psychoeducation regarding the importance of self-care utilizing the oxygen mask metaphor was provided. Psychoeducation regarding pleasurable activities, including its impact on emotional eating and overall well-being was also provided. Ima was provided with a handout with various options of pleasurable activities, and was encouraged  to engage in one activity a day and additional activities as needed when triggered to emotionally eat. Jenna Chapman agreed. Jenna Chapman provided verbal consent during today's appointment for this provider to send a handout with activities via e-mail. Overall, Jenna Chapman was receptive to today's appointment as evidenced by openness to sharing, responsiveness to feedback, and willingness to engage in pleasurable activities to assist with coping. Additionally, she reported feeling better and smiling at the end of today's appointment.   Mental Status Examination:  Appearance: neat Behavior: appropriate to circumstances Mood: emotional; sad Affect: mood congruent Speech: WNL Eye Contact: appropriate Psychomotor Activity: WNL Gait: unable to assess Thought Process: linear, logical, and goal directed and denies suicidal, homicidal, and self-harm ideation, plan and intent since the last appointment with this provider  Thought Content/Perception: no hallucinations, delusions, bizarre thinking or behavior endorsed or observed Orientation: AAOx4 Memory/Concentration: intact Insight: fair Judgment: fair  Interventions:  Conducted a brief chart review Conducted a risk assessment Provided empathic reflections and validation Reviewed content from the previous session Provided positive reinforcement Employed supportive psychotherapy interventions to facilitate reduced distress and to improve coping skills with identified stressors Psychoeducation provided regarding pleasurable activities Psychoeducation provided regarding self-care  DSM-5 Diagnosis(es): F50.89 Other Specified Feeding or Eating Disorder, Emotional Eating Behaviors, F90.9 Unspecified Attention-Deficit/Hyperactivity Disorder , F41.9 Unspecified Anxiety Disorder, and  F32.A Unspecified Depressive Disorder  Treatment Goal & Progress: During the initial appointment with this provider, the following treatment goal was established: increase coping skills.  Jenna Chapman has demonstrated progress in her goal as evidenced by increased awareness of hunger patterns. Jenna Chapman also continues to demonstrate willingness to engage in learned skill(s).  Plan: The next appointment is scheduled for 09/16/2023 at 2:30pm, which will be via MyChart Video Visit. The next session will focus on working towards the established treatment goal. Jenna Chapman will continue with her  primary therapist. Jenna Chapman noted a plan to f/u with shared referral options for marriage counseling today afternoon.    Wyatt Fire, PsyD

## 2023-09-05 ENCOUNTER — Ambulatory Visit: Admitting: Licensed Clinical Social Worker

## 2023-09-16 ENCOUNTER — Telehealth (INDEPENDENT_AMBULATORY_CARE_PROVIDER_SITE_OTHER): Admitting: Psychology

## 2023-09-16 ENCOUNTER — Ambulatory Visit: Admitting: Nurse Practitioner

## 2023-09-16 ENCOUNTER — Encounter: Payer: Self-pay | Admitting: Nurse Practitioner

## 2023-09-16 VITALS — BP 104/78 | HR 88 | Ht 67.0 in | Wt 222.0 lb

## 2023-09-16 DIAGNOSIS — R0683 Snoring: Secondary | ICD-10-CM

## 2023-09-16 DIAGNOSIS — J439 Emphysema, unspecified: Secondary | ICD-10-CM

## 2023-09-16 DIAGNOSIS — E66811 Obesity, class 1: Secondary | ICD-10-CM

## 2023-09-16 DIAGNOSIS — J432 Centrilobular emphysema: Secondary | ICD-10-CM

## 2023-09-16 DIAGNOSIS — Z6834 Body mass index (BMI) 34.0-34.9, adult: Secondary | ICD-10-CM

## 2023-09-16 DIAGNOSIS — G4733 Obstructive sleep apnea (adult) (pediatric): Secondary | ICD-10-CM

## 2023-09-16 DIAGNOSIS — G4719 Other hypersomnia: Secondary | ICD-10-CM

## 2023-09-16 DIAGNOSIS — J4489 Other specified chronic obstructive pulmonary disease: Secondary | ICD-10-CM

## 2023-09-16 NOTE — Assessment & Plan Note (Signed)
 BMI 34. Healthy weight loss encouraged.

## 2023-09-16 NOTE — Progress Notes (Signed)
 @Patient  ID: Jenna Chapman, female    DOB: 10/07/1970, 53 y.o.   MRN: [id number]  Chief Complaint  Patient presents with   Consult    Pt states here for sleep issues all of the above     Referring provider: Rosalea Rosina SAILOR, PA  HPI: 54 year old female, former heavy smoker referred for sleep consult. She also sees Dr. Meade for COPD. Past medical history significant for migraines, allergic rhinitis, GERD, hx of subdural hematoma, depression, anxiety, obesity.   TEST/EVENTS:   09/16/2023: Today - sleep consult  Discussed the use of AI scribe software for clinical note transcription with the patient, who gave verbal consent to proceed.  History of Present Illness Jenna Chapman is a 53 year old female with COPD who presents with sleep disturbances and suspected sleep apnea.  She experiences difficulty with sleep, characterized by snoring and breathing issues at night. Her husband has noted her tossing and turning during sleep, and she wakes up feeling tired despite sleeping for seven to eight hours. She experiences dry mouth upon waking and sometimes has morning headaches. There are occasional issues with drowsy driving, but she has never fallen asleep while driving. No history of sleepwalking or sleep paralysis.  She has experienced significant weight changes, having increased from 180 pounds to 246 pounds, and is currently at 222 pounds.  Her husband has observed her gasping for breath during sleep, raising concerns about sleep apnea. She has been diagnosed with COPD, which may contribute to her daytime fatigue and shortness of breath, particularly at work. Breathing is stable. No recent exacerbations.   She works as a Social research officer, government and consumes a cup of coffee in the morning. She denies alcohol intake. She does not take any dedicated sleep medications or over-the-counter sleep aids.  She goes to bed around 10-11 pm. Falls asleep within 30 min. Wakes a couple times a night.  Gets up around 530 am. Had a sleep study in 2016 but has never been on CPAP. Weight is up since then.   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    Tobacco History: Social History   Tobacco Use  Smoking Status Former   Current packs/day: 0.00   Average packs/day: 3.0 packs/day for 32.0 years (96.0 ttl pk-yrs)   Types: Cigarettes   Start date: 35   Quit date: 2017   Years since quitting: 8.7  Smokeless Tobacco Never  Tobacco Comments   Quit in May 2017   Counseling given: Not Answered Tobacco comments: Quit in May 2017   Outpatient Medications Prior to Visit  Medication Sig Dispense Refill   albuterol  (VENTOLIN  HFA) 108 (90 Base) MCG/ACT inhaler Inhale 2 puffs into the lungs every 6 (six) hours as needed for wheezing or shortness of breath. 8 g 11   buPROPion (WELLBUTRIN XL) 300 MG 24 hr tablet Take 300 mg by mouth daily.     estradiol (ESTRACE) 2 MG tablet Take 2 mg by mouth daily.  11   methenamine (HIPREX) 1 g tablet Take 1 g by mouth 2 (two) times daily with a meal.     Tiotropium Bromide-Olodaterol (STIOLTO RESPIMAT ) 2.5-2.5 MCG/ACT  AERS Inhale 2 puffs into the lungs daily. 1 each 11   No facility-administered medications prior to visit.     Review of Systems: As above    Physical Exam:  BP 104/78   Pulse 88   Ht 5' 7 (1.702 m)   Wt 222 lb (100.7 kg)   SpO2 98%   BMI 34.77 kg/m   GEN: Pleasant, interactive, well-appearing; obese; in no acute distress HEENT:  Normocephalic and atraumatic. PERRLA. Sclera white. Nasal turbinates pink, moist and patent bilaterally. No rhinorrhea present. Oropharynx pink and moist, without exudate or edema. No lesions, ulcerations, or  postnasal drip. Mallampati III NECK:  Supple w/ fair ROM. Thyroid symmetrical with no goiter or nodules palpated. No lymphadenopathy.   CV: RRR, no m/r/g, no peripheral edema. Pulses intact, +2 bilaterally. No cyanosis, pallor or clubbing. PULMONARY:  Unlabored, regular breathing. Clear bilaterally A&P w/o wheezes/rales/rhonchi. No accessory muscle use.  GI: BS present and normoactive. Soft, non-tender to palpation.  MSK: No erythema, warmth or tenderness. Cap refil <2 sec all extrem.  Neuro: A/Ox3. No focal deficits noted.   Skin: Warm, no lesions or rashe Psych: Normal affect and behavior. Judgement and thought content appropriate.     Lab Results:  CBC    Component Value Date/Time   WBC 4.6 06/20/2023 0943   WBC 6.7 03/22/2023 0838   RBC 4.24 06/20/2023 0943   RBC 4.06 03/22/2023 0838   HGB 13.0 06/20/2023 0943   HCT 39.8 06/20/2023 0943   PLT 220 06/20/2023 0943   MCV 94 06/20/2023 0943   MCH 30.7 06/20/2023 0943   MCH 30.5 03/22/2023 0838   MCHC 32.7 06/20/2023 0943   MCHC 32.9 03/22/2023 0838   RDW 12.1 06/20/2023 0943   LYMPHSABS 1.5 06/20/2023 0943   MONOABS 0.6 06/27/2012 1642   EOSABS 0.2 06/20/2023 0943   BASOSABS 0.0 06/20/2023 0943    BMET    Component Value Date/Time   NA 139 06/20/2023 0943   K 4.6 06/20/2023 0943   CL 101 06/20/2023 0943   CO2 22 06/20/2023 0943   GLUCOSE 87 06/20/2023 0943   GLUCOSE 90 06/24/2014 1928   BUN 17 06/20/2023 0943   CREATININE 0.82 06/20/2023 0943   CREATININE 0.84 06/24/2014 1928   CALCIUM 9.5 06/20/2023 0943   GFRNONAA 89 06/07/2016 0817   GFRAA 102 06/07/2016 0817    BNP No results found for: BNP   Imaging:  No results found.  Administration History     None          Latest Ref Rng & Units 08/15/2023    8:31 AM  PFT Results  FVC-Pre L 3.02   FVC-Predicted Pre % 78   FVC-Post L 2.22   FVC-Predicted Post % 57   Pre FEV1/FVC % % 78   Post FEV1/FCV % % 80   FEV1-Pre L 2.37   FEV1-Predicted Pre  % 78   FEV1-Post L 1.78   DLCO uncorrected ml/min/mmHg 17.31   DLCO UNC% % 75   DLCO corrected ml/min/mmHg 17.53   DLCO COR %Predicted % 76   DLVA Predicted % 91   TLC L 5.26   TLC % Predicted % 95   RV % Predicted % 109     No results found for: NITRICOXIDE      Assessment & Plan:   Excessive daytime sleepiness She has snoring, excessive daytime sleepiness, nocturnal apneic events, morning headaches, restless sleep. BMI 34. Epworth 20. Given this,  I am concerned she could have  sleep disordered breathing with obstructive sleep apnea. She will need sleep study for further evaluation.    - discussed how weight can impact sleep and risk for sleep disordered breathing - discussed options to assist with weight loss: combination of diet modification, cardiovascular and strength training exercises   - had an extensive discussion regarding the adverse health consequences related to untreated sleep disordered breathing - specifically discussed the risks for hypertension, coronary artery disease, cardiac dysrhythmias, cerebrovascular disease, and diabetes - lifestyle modification discussed   - discussed how sleep disruption can increase risk of accidents, particularly when driving - safe driving practices were discussed  Patient Instructions  Given your symptoms, I am concerned that you may have sleep disordered breathing with sleep apnea. You will need a sleep study for further evaluation. Someone will contact you to schedule this.   We discussed how untreated sleep apnea puts an individual at risk for cardiac arrhthymias, pulm HTN, DM, stroke and increases their risk for daytime accidents. We also briefly reviewed treatment options including weight loss, side sleeping position, oral appliance, CPAP therapy or referral to ENT for possible surgical options  Use caution when driving and pull over if you become sleepy.  Follow up in 6 weeks with Katie Gianlucca Szymborski,NP to go over sleep study  results, or sooner, if needed. Friday PM virtual clinic preferred        Class 1 obesity with body mass index (BMI) of 34.0 to 34.9 in adult BMI 34. Healthy weight loss encouraged  Centrilobular emphysema (HCC) Stable without recent exacerbation. Suspect decreased stamina/deconditioning contributing to symptoms of DOE during the day. Action plan in place. Follow up with Dr. Meade as scheduled   Advised if symptoms do not improve or worsen, to please contact office for sooner follow up or seek emergency care.   I spent 45 minutes of dedicated to the care of this patient on the date of this encounter to include pre-visit review of records, face-to-face time with the patient discussing conditions above, post visit ordering of testing, clinical documentation with the electronic health record, making appropriate referrals as documented, and communicating necessary findings to members of the patients care team.  Jenna LULLA Rouleau, NP 09/16/2023  Pt aware and understands NP's role.

## 2023-09-16 NOTE — Patient Instructions (Signed)

## 2023-09-16 NOTE — Assessment & Plan Note (Signed)
 She has snoring, excessive daytime sleepiness, nocturnal apneic events, morning headaches, restless sleep. BMI 34. Epworth 20. Given this,  I am concerned she could have sleep disordered breathing with obstructive sleep apnea. She will need sleep study for further evaluation.    - discussed how weight can impact sleep and risk for sleep disordered breathing - discussed options to assist with weight loss: combination of diet modification, cardiovascular and strength training exercises   - had an extensive discussion regarding the adverse health consequences related to untreated sleep disordered breathing - specifically discussed the risks for hypertension, coronary artery disease, cardiac dysrhythmias, cerebrovascular disease, and diabetes - lifestyle modification discussed   - discussed how sleep disruption can increase risk of accidents, particularly when driving - safe driving practices were discussed  Patient Instructions  Given your symptoms, I am concerned that you may have sleep disordered breathing with sleep apnea. You will need a sleep study for further evaluation. Someone will contact you to schedule this.   We discussed how untreated sleep apnea puts an individual at risk for cardiac arrhthymias, pulm HTN, DM, stroke and increases their risk for daytime accidents. We also briefly reviewed treatment options including weight loss, side sleeping position, oral appliance, CPAP therapy or referral to ENT for possible surgical options  Use caution when driving and pull over if you become sleepy.  Follow up in 6 weeks with Katie Marianita Botkin,NP to go over sleep study results, or sooner, if needed. Friday PM virtual clinic preferred

## 2023-09-16 NOTE — Assessment & Plan Note (Signed)
 Stable without recent exacerbation. Suspect decreased stamina/deconditioning contributing to symptoms of DOE during the day. Action plan in place. Follow up with Dr. Meade as scheduled

## 2023-09-26 ENCOUNTER — Ambulatory Visit (INDEPENDENT_AMBULATORY_CARE_PROVIDER_SITE_OTHER): Admitting: Family Medicine

## 2023-10-01 ENCOUNTER — Encounter

## 2023-10-01 DIAGNOSIS — R0683 Snoring: Secondary | ICD-10-CM

## 2023-10-01 DIAGNOSIS — G4719 Other hypersomnia: Secondary | ICD-10-CM

## 2023-10-03 ENCOUNTER — Ambulatory Visit: Admitting: Licensed Clinical Social Worker

## 2023-10-16 ENCOUNTER — Encounter (INDEPENDENT_AMBULATORY_CARE_PROVIDER_SITE_OTHER): Payer: Self-pay

## 2023-10-18 DIAGNOSIS — G4733 Obstructive sleep apnea (adult) (pediatric): Secondary | ICD-10-CM | POA: Diagnosis not present

## 2023-10-24 ENCOUNTER — Ambulatory Visit: Admitting: Licensed Clinical Social Worker

## 2023-11-04 ENCOUNTER — Other Ambulatory Visit: Payer: Self-pay | Admitting: Medical Genetics

## 2023-11-04 DIAGNOSIS — Z006 Encounter for examination for normal comparison and control in clinical research program: Secondary | ICD-10-CM

## 2023-11-07 ENCOUNTER — Encounter (INDEPENDENT_AMBULATORY_CARE_PROVIDER_SITE_OTHER): Payer: Self-pay | Admitting: Family Medicine

## 2023-11-07 ENCOUNTER — Ambulatory Visit: Admitting: Licensed Clinical Social Worker

## 2023-11-07 ENCOUNTER — Ambulatory Visit (INDEPENDENT_AMBULATORY_CARE_PROVIDER_SITE_OTHER): Payer: Self-pay | Admitting: Family Medicine

## 2023-11-07 VITALS — BP 113/74 | HR 61 | Temp 97.7°F | Ht 67.0 in | Wt 213.0 lb

## 2023-11-07 DIAGNOSIS — F411 Generalized anxiety disorder: Secondary | ICD-10-CM

## 2023-11-07 DIAGNOSIS — F5089 Other specified eating disorder: Secondary | ICD-10-CM | POA: Diagnosis not present

## 2023-11-07 DIAGNOSIS — E88819 Insulin resistance, unspecified: Secondary | ICD-10-CM

## 2023-11-07 DIAGNOSIS — F331 Major depressive disorder, recurrent, moderate: Secondary | ICD-10-CM | POA: Diagnosis not present

## 2023-11-07 DIAGNOSIS — E669 Obesity, unspecified: Secondary | ICD-10-CM

## 2023-11-07 DIAGNOSIS — Z6834 Body mass index (BMI) 34.0-34.9, adult: Secondary | ICD-10-CM

## 2023-11-07 NOTE — Progress Notes (Addendum)
 [facility] Counselor/Therapist Progress Note  Patient ID: Neisha Hinger, MRN: [id number]    Date: 11/07/23  Time Spent: 102  pm - 156 pm : 54 Minutes  Treatment Type: Individual Therapy.  Reported Symptoms: Patient reports that she is angry all the time, and it has been going on for about 20 years. It has got worse over the past few months. Patient reports that she has depression and anxiety. She states that she takes it out on her spouse.  Depression, Anxiety, Mood Swings, Appetite Change, Sleep Changes, Work Problems, Loss of Interest , Irritability , Excessive Worrying, Suicidal Thoughts-Passing, no current thoughts or plan, Marital Stress, Low Energy, Ritualistic Behaviors, Change in Sexual Interest , and Poor Concentration    Mental Status Exam: Appearance:  Casual     Behavior: Appropriate  Motor: Normal  Speech/Language:  Clear and Coherent  Affect: Appropriate  Mood: normal  Thought process: normal  Thought content:   WNL  Sensory/Perceptual disturbances:   WNL  Orientation: oriented to person, place, time/date, situation, day of week, month of year, and year  Attention: Good  Concentration: Good  Memory: WNL  Fund of knowledge:  Good  Insight:   Good  Judgment:  Good  Impulse Control: Good   Risk Assessment: Danger to Self:  No Self-injurious Behavior: No Danger to Others: No Duty to Warn:no Physical Aggression / Violence:No  Access to Firearms a concern: No  Gang Involvement:No   Subjective:   [name] participated in person from office, located at [facility] with Clinician present.Gigi consented to treatment.   Atalya presented for her session stating that she has been very stressed. She reports that the financial burden falling on her has been overwhelming. She reports that her husband was fired from a job of over 30 years and she has been carrying all of their bills. She reports that he needs to go back to work and he  refuses to go to work for less than he was making or doing anything other than what he knows. Patient states that the marriage is strained due to both finances and his jealousy. Patient states that this has had a dramatic affect on her and how she responds to her both her spouse and life stressors.   Clinician actively listened and provided support via verbal feedback. Clinician encouraged patient to prioritize self care and to give herself grace. We processed transparency when communicating with her spouse and setting boundaries to support her mental wellness.  Nelma was fully engaged in session and was motivated for treatment. Sereena will use CBT, mindfulness and coping skills to help manage decrease symptoms associated with their diagnosis. Patient will reduce overall level, frequency, and intensity of the feelings of depression, anxiety and panic evidenced by   decreased irritability, negative self talk, and helpless feelings from 6 to 7 days/week to 0 to 1 days/week per client report for at least 3 consecutive months.  Treatment planning to be reviewed by 08/12/2024.    Interventions: Cognitive Behavioral Therapy, Motivational Interviewing, and Solution-Oriented/Positive Psychology  Diagnosis: Major Depressive Disorder, recurrent moderate/ Generalized Anxiety Disorder   Damien Junk MSW, LCSW/DATE 11/07/2023

## 2023-11-07 NOTE — Progress Notes (Signed)
 Jenna Chapman, D.O.  ABFM, ABOM Specializing in Clinical Bariatric Medicine  Office located at: [address] [place], [place]  [address]      A) FOR THE CHRONIC DISEASE OF OBESITY:  Chief complaint: Obesity Jenna Chapman is here to discuss her progress with her obesity treatment plan.   History of present illness / Interval history:  Jenna Chapman is here today for her follow-up office visit.  Since last OV on 08/22/23, pt is down 7 lbs. Patient states that she went on vacation to [place] for a week. She since then has had trouble getting back on plan. She states that she is only eating breakfast and nothing else.     08/22/23 10:00 11/07/23 10:00   Body Fat % 45.5 % 44.3 %  Muscle Mass (lbs) 113.8 lbs 112.6 lbs  Fat Mass (lbs) 100.2 lbs 94.4 lbs  Total Body Water (lbs) 80.4 lbs 77.6 lbs  Visceral Fat Rating  12 11    Counseling done on how various foods will affect these numbers and how to maximize success   Total lbs lost to date: -24 lbs Total Fat Mass in lbs lost to date: - 18.2 lbs Total weight loss percentage to date: - 10.13 %    Obesity, Beginning BMI 37.2 BMI 34.0-34.9,adult current 34.93 Nutrition Therapy She is on Cat 2 MP and states she is following her eating plan approximately 50 % of the time.   - Tracking Calories/Macros: no   - Eating More Whole Foods: yes  - Adequate Protein Intake: no   - Adequate Water Intake: yes  - Skipping Meals: yes  - Sleeping 7-9 Hours/ Night: no    [name] is currently in the action stage of change. As such, her goal is to continue weight management plan.  She has agreed to: Journal 1200 calories and 90++ g protein    Physical Activity [name] is not exercising.   [name] has been advised to work up to 300-450 minutes of moderate intensity aerobic activity a week and strengthening exercises 2-3 times per week for cardiovascular health, weight loss maintenance and preservation of muscle mass.  She has  agreed to : Think about enjoyable ways to increase daily physical activity and overcoming barriers to exercise   Behavioral Modifications Evidence-based interventions for health behavior change were utilized today including the discussion of  1) self monitoring techniques:    - Measure proteins and weigh veggies   2) problem-solving barriers:    - Journal   3) SMART goals for next OV:    - Get back on track and journal   Regarding patient's less desirable eating habits and patterns, we employed the technique of small changes.   We discussed the following today: increasing lean protein intake to established goals, avoiding skipping meals, increasing water intake , work on meal planning and preparation, and work on tracking and journaling calories using tracking application ways to get back on track  Additional resources provided today: Handout on practicing mindfulness around eating, Handout on Daily Food Journaling Log, and Provided patient with personalized instruction given on how to measure portions and weigh foods for accurate calculation of calories   Medical Interventions/ Pharmacotherapy Previous Bariatric surgery: n/a Pharmacotherapy for weight loss: She is currently taking Wellbutrin XL 300 mg daily for medical weight loss.    We discussed various medication options to help Briceida with her weight loss efforts and we both agreed to : Continue with current nutritional and behavioral strategies  B) OBESITY RELATED CONDITIONS ADDRESSED TODAY:  Insulin  resistance Assessment & Plan Lab Results  Component Value Date   HGBA1C 5.3 06/20/2023   HGBA1C 5.2 06/07/2016   INSULIN  12.4 06/20/2023    Managed with diet and life style changes. Patient states that she is currently having trouble getting back on track with following her meal plan. Patient states that she has had an increase in cravings since she has not focused on her lean proteins. Extensive discussion on how tracking  calories and protein will help get patient back on plan. Explained to patient the connection between simple carbs and insulin  and hunger signals. Continue following prudent meal plan and decreasing simple carbs and sugars.     Other Specified Feeding or Eating Disorder, Emotional Eating Behaviors Assessment & Plan On Wellbutrin XL 300 mg daily. Good compliance and tolerance. Patient states that she done more emotional eating since she has not been following the meal plan. She endorses drinking juice shots that help with energy, mood, gut, immune and detox. She states that these make her feel better. Encouraged patient to refocus on meal plan and increase exercise to help mood. Continue with medications and building strategies to improve emotional eating.      Follow up:   Return 12/12/2023 at 11:20 AM  She was informed of the importance of frequent follow up visits to maximize her success with intensive lifestyle modifications for her multiple health conditions.   Weight Summary and Biometrics   Weight Lost Since Last Visit: 7lb  Weight Gained Since Last Visit: 0    Vitals Temp: 97.7 F (36.5 C) BP: 113/74 Pulse Rate: 61 SpO2: 98 %   Anthropometric Measurements Height: 5' 7 (1.702 m) Weight: 213 lb (96.6 kg) BMI (Calculated): 33.35 Weight at Last Visit: 220lb Weight Lost Since Last Visit: 7lb Weight Gained Since Last Visit: 0 Starting Weight: 237lb Total Weight Loss (lbs): 24 lb (10.9 kg)   Body Composition  Body Fat %: 44.3 % Fat Mass (lbs): 94.4 lbs Muscle Mass (lbs): 112.6 lbs Total Body Water (lbs): 77.6 lbs Visceral Fat Rating : 11   Other Clinical Data Fasting: no Labs: no Today's Visit #: 5 Starting Date: 06/20/23    Objective:   PHYSICAL EXAM: Blood pressure 113/74, pulse 61, temperature 97.7 F (36.5 C), height 5' 7 (1.702 m), weight 213 lb (96.6 kg), SpO2 98%. Body mass index is 33.36 kg/m.  General: she is overweight, cooperative and in no  acute distress. PSYCH: Has normal mood, affect and thought process.   HEENT: EOMI, sclerae are anicteric. Lungs: Normal breathing effort, no conversational dyspnea. Extremities: Moves * 4 Neurologic: A and O * 3, good insight  DIAGNOSTIC DATA REVIEWED: BMET    Component Value Date/Time   NA 139 06/20/2023 0943   K 4.6 06/20/2023 0943   CL 101 06/20/2023 0943   CO2 22 06/20/2023 0943   GLUCOSE 87 06/20/2023 0943   GLUCOSE 90 06/24/2014 1928   BUN 17 06/20/2023 0943   CREATININE 0.82 06/20/2023 0943   CREATININE 0.84 06/24/2014 1928   CALCIUM 9.5 06/20/2023 0943   GFRNONAA 89 06/07/2016 0817   GFRAA 102 06/07/2016 0817   Lab Results  Component Value Date   HGBA1C 5.3 06/20/2023   HGBA1C 5.2 06/07/2016   Lab Results  Component Value Date   INSULIN  12.4 06/20/2023   Lab Results  Component Value Date   TSH 1.770 06/20/2023   CBC    Component Value Date/Time   WBC 4.6 06/20/2023 0943  WBC 6.7 03/22/2023 0838   RBC 4.24 06/20/2023 0943   RBC 4.06 03/22/2023 0838   HGB 13.0 06/20/2023 0943   HCT 39.8 06/20/2023 0943   PLT 220 06/20/2023 0943   MCV 94 06/20/2023 0943   MCH 30.7 06/20/2023 0943   MCH 30.5 03/22/2023 0838   MCHC 32.7 06/20/2023 0943   MCHC 32.9 03/22/2023 0838   RDW 12.1 06/20/2023 0943   Iron Studies No results found for: IRON, TIBC, FERRITIN, IRONPCTSAT Lipid Panel     Component Value Date/Time   CHOL 167 06/20/2023 0943   TRIG 76 06/20/2023 0943   HDL 52 06/20/2023 0943   CHOLHDL 3.2 06/20/2023 0943   LDLCALC 101 (H) 06/20/2023 0943   Hepatic Function Panel     Component Value Date/Time   PROT 7.0 06/20/2023 0943   ALBUMIN 4.3 06/20/2023 0943   AST 31 06/20/2023 0943   ALT 34 (H) 06/20/2023 0943   ALKPHOS 92 06/20/2023 0943   BILITOT 0.6 06/20/2023 0943      Component Value Date/Time   TSH 1.770 06/20/2023 0943   Nutritional Lab Results  Component Value Date   VD25OH 45.8 06/20/2023    Attestations:   LILLETTE Sonny Laroche, acting as a medical scribe for Jenna Jenkins, DO., have compiled all relevant documentation for today's office visit on behalf of Jenna Jenkins, DO, while in the presence of [employer], DO.  I have spent 41 minutes in the care of the patient today including 31 minutes face-to-face assessing and reviewing listed medical problems above as outlined in office visit note and providing nutritional and behavioral counseling as outlined in obesity care plan.   I have reviewed the above documentation for accuracy and completeness, and I agree with the above. Jenna JINNY Chapman, D.O.  The 21st Century Cures Act was signed into law in 2016 which includes the topic of electronic health records.  This provides immediate access to information in MyChart.  This includes consultation notes, operative notes, office notes, lab results and pathology reports.  If you have any questions about what you read please let us  know at your next visit so we can discuss your concerns and take corrective action if need be.  We are right here with you.

## 2023-11-08 ENCOUNTER — Telehealth: Admitting: Nurse Practitioner

## 2023-11-08 ENCOUNTER — Encounter: Payer: Self-pay | Admitting: Nurse Practitioner

## 2023-11-08 DIAGNOSIS — E66811 Obesity, class 1: Secondary | ICD-10-CM | POA: Diagnosis not present

## 2023-11-08 DIAGNOSIS — J432 Centrilobular emphysema: Secondary | ICD-10-CM

## 2023-11-08 DIAGNOSIS — G4733 Obstructive sleep apnea (adult) (pediatric): Secondary | ICD-10-CM | POA: Diagnosis not present

## 2023-11-08 DIAGNOSIS — Z6834 Body mass index (BMI) 34.0-34.9, adult: Secondary | ICD-10-CM

## 2023-11-08 NOTE — Assessment & Plan Note (Signed)
 Stable without recent exacerbation. Action plan in place. Follow up with Dr. Meade as scheduled

## 2023-11-08 NOTE — Progress Notes (Signed)
 @Patient  ID: Jenna Chapman, female    DOB: 1970-08-09, 53 y.o.   MRN: [id number]  No chief complaint on file.   Referring provider: Rosalea Rosina SAILOR, PA  Virtual Visit via Video Note  I connected with Jenna Chapman on 11/08/23 at  2:30 PM EST by a video enabled telemedicine application and verified that I am speaking with the correct person using two identifiers.  Location: Patient: Home Provider: Office   I discussed the limitations of evaluation and management by telemedicine and the availability of in person appointments. The patient expressed understanding and agreed to proceed.  History of Present Illness: 53 year old female, former heavy smoker referred for sleep consult. She also sees Dr. Meade for COPD. Past medical history significant for migraines, allergic rhinitis, GERD, hx of subdural hematoma, depression, anxiety, obesity.   TEST/EVENTS:  10/01/2023 HST: AHI 19/h, SpO2 low 78%  09/16/2023: OV with Tiarah Shisler NP. Jenna Chapman is a 53 year old female with COPD who presents with sleep disturbances and suspected sleep apnea. She experiences difficulty with sleep, characterized by snoring and breathing issues at night. Her husband has noted her tossing and turning during sleep, and she wakes up feeling tired despite sleeping for seven to eight hours. She experiences dry mouth upon waking and sometimes has morning headaches. There are occasional issues with drowsy driving, but she has never fallen asleep while driving. No history of sleepwalking or sleep paralysis. She has experienced significant weight changes, having increased from 180 pounds to 246 pounds, and is currently at 222 pounds. Her husband has observed her gasping for breath during sleep, raising concerns about sleep apnea. She has been diagnosed with COPD, which may contribute to her daytime fatigue and shortness of breath, particularly at work. Breathing is stable. No recent exacerbations.  She works as  a social research officer, government and consumes a cup of coffee in the morning. She denies alcohol intake. She does not take any dedicated sleep medications or over-the-counter sleep aids. She goes to bed around 10-11 pm. Falls asleep within 30 min. Wakes a couple times a night. Gets up around 530 am. Had a sleep study in 2016 but has never been on CPAP. Weight is up since then.  [place] 20  11/08/2023: Today - follow up Discussed the use of AI scribe software for clinical note transcription with the patient, who gave verbal consent to proceed.  History of Present Illness  Jenna Chapman is a 53 year old female who presents for follow-up regarding her sleep study results.  She experiences trouble with nighttime sleep, daytime sleepiness, and restless sleep at night. She underwent a sleep study due to these symptoms. Feels unchanged compared to her last visit. No drowsy driving.   She is not on any medications for weight loss but is seeing a weight specialist. She reports a recent weight loss from 221 pounds to 212 pounds.    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    Tobacco History: Social History   Tobacco Use  Smoking Status Former   Current packs/day: 0.00   Average packs/day: 3.0 packs/day for 32.0 years (96.0 ttl pk-yrs)   Types: Cigarettes   Start  date: 32   Quit date: 2017   Years since quitting: 8.8  Smokeless Tobacco Never  Tobacco Comments   Quit in May 2017   Counseling given: Not Answered Tobacco comments: Quit in May 2017   Outpatient Medications Prior to Visit  Medication Sig Dispense Refill   albuterol  (VENTOLIN  HFA) 108 (90 Base) MCG/ACT inhaler Inhale 2 puffs into the lungs every 6 (six) hours  as needed for wheezing or shortness of breath. 8 g 11   buPROPion (WELLBUTRIN XL) 300 MG 24 hr tablet Take 300 mg by mouth daily.     estradiol (ESTRACE) 2 MG tablet Take 2 mg by mouth daily.  11   methenamine (HIPREX) 1 g tablet Take 1 g by mouth 2 (two) times daily with a meal.     Tiotropium Bromide-Olodaterol (STIOLTO RESPIMAT ) 2.5-2.5 MCG/ACT AERS Inhale 2 puffs into the lungs daily. 1 each 11   No facility-administered medications prior to visit.     Review of Systems: As above    Physical Exam: Patient is well-developed, well-nourished in no acute distress.  Resting comfortably at home.  No labored breathing.  Speech is clear and coherent with logical content.  Patient is alert and oriented at baseline.     Lab Results:  CBC    Component Value Date/Time   WBC 4.6 06/20/2023 0943   WBC 6.7 03/22/2023 0838   RBC 4.24 06/20/2023 0943   RBC 4.06 03/22/2023 0838   HGB 13.0 06/20/2023 0943   HCT 39.8 06/20/2023 0943   PLT 220 06/20/2023 0943   MCV 94 06/20/2023 0943   MCH 30.7 06/20/2023 0943   MCH 30.5 03/22/2023 0838   MCHC 32.7 06/20/2023 0943   MCHC 32.9 03/22/2023 0838   RDW 12.1 06/20/2023 0943   LYMPHSABS 1.5 06/20/2023 0943   MONOABS 0.6 06/27/2012 1642   EOSABS 0.2 06/20/2023 0943   BASOSABS 0.0 06/20/2023 0943    BMET    Component Value Date/Time   NA 139 06/20/2023 0943   K 4.6 06/20/2023 0943   CL 101 06/20/2023 0943   CO2 22 06/20/2023 0943   GLUCOSE 87 06/20/2023 0943   GLUCOSE 90 06/24/2014 1928   BUN 17 06/20/2023 0943   CREATININE 0.82 06/20/2023 0943   CREATININE 0.84 06/24/2014 1928   CALCIUM 9.5 06/20/2023 0943   GFRNONAA 89 06/07/2016 0817   GFRAA 102 06/07/2016 0817    BNP No results found for: BNP   Imaging:  No results found.  Administration History     None          Latest Ref Rng & Units 08/15/2023    8:31 AM  PFT Results  FVC-Pre L 3.02   FVC-Predicted Pre % 78   FVC-Post L 2.22   FVC-Predicted Post %  57   Pre FEV1/FVC % % 78   Post FEV1/FCV % % 80   FEV1-Pre L 2.37   FEV1-Predicted Pre % 78   FEV1-Post L 1.78   DLCO uncorrected ml/min/mmHg 17.31   DLCO UNC% % 75   DLCO corrected ml/min/mmHg 17.53   DLCO COR %Predicted % 76   DLVA Predicted % 91   TLC L 5.26   TLC % Predicted % 95   RV % Predicted % 109     No results found for: NITRICOXIDE      Assessment & Plan:   Moderate obstructive sleep apnea Moderate OSA. Reviewed risks of untreated OSA and potential treatment options. Given severity, recommendation was made for CPAP therapy.  Orders placed for new start auto CPAP 5-15 cmH2O, mask of choice and heated humidity. Educated on proper use/care of device. Risks/benefits reviewed. Healthy weight loss encouraged.  Patient Instructions  Start CPAP every night, minimum of 4-6 hours a night.  Change equipment as directed. Wash your tubing with warm soap and water daily, hang to dry. Wash humidifier portion weekly. Use bottled, distilled water and change daily Be aware of reduced alertness and do not drive or operate heavy machinery if experiencing this or drowsiness.  Exercise encouraged, as tolerated. Healthy weight management discussed.  Avoid or decrease alcohol consumption and medications that make you more sleepy, if possible. Notify if persistent daytime sleepiness occurs even with consistent use of PAP therapy.  Change CPAP supplies... Every month Mask cushions and/or nasal pillows CPAP machine filters Every 3 months Mask frame (not including the headgear) CPAP tubing Every 6 months Mask headgear Chin strap (if applicable) Humidifier water tub  Call if you haven't heard something about the CPAP in the next 2-3 weeks  We discussed how untreated sleep apnea puts an individual at risk for cardiac arrhthymias, pulm HTN, DM, stroke and increases their risk for daytime accidents. We also briefly reviewed treatment options including weight loss, side sleeping  position, oral appliance, CPAP therapy or referral to ENT for possible surgical options  Follow up 1/8 at 9 am in office with Katie Tarea Skillman,NP. If symptoms do not improve or worsen, please contact office for sooner follow up   Class 1 obesity with body mass index (BMI) of 34.0 to 34.9 in adult See above. Continue with healthy weight management. Would be an appropriate candidate for GLP 1 therapy with Zepbound in setting of OSA. Advised to discuss with weight management provider and notify if she would like our assistance with prescribing this.   Centrilobular emphysema (HCC) Stable without recent exacerbation. Action plan in place. Follow up with Dr. Desai as scheduled      I discussed the assessment and treatment plan with the patient. The patient was provided an opportunity to ask questions and all were answered. The patient agreed with the plan and demonstrated an understanding of the instructions.   The patient was advised to call back or seek an in-person evaluation if the symptoms worsen or if the condition fails to improve as anticipated.  I provided 31 minutes of non-face-to-face time during this encounter.  Comer LULLA Rouleau, NP 11/08/2023  Pt aware and understands NP's role.

## 2023-11-08 NOTE — Patient Instructions (Addendum)
 Start CPAP every night, minimum of 4-6 hours a night.  Change equipment as directed. Wash your tubing with warm soap and water daily, hang to dry. Wash humidifier portion weekly. Use bottled, distilled water and change daily Be aware of reduced alertness and do not drive or operate heavy machinery if experiencing this or drowsiness.  Exercise encouraged, as tolerated. Healthy weight management discussed.  Avoid or decrease alcohol consumption and medications that make you more sleepy, if possible. Notify if persistent daytime sleepiness occurs even with consistent use of PAP therapy.  Change CPAP supplies... Every month Mask cushions and/or nasal pillows CPAP machine filters Every 3 months Mask frame (not including the headgear) CPAP tubing Every 6 months Mask headgear Chin strap (if applicable) Humidifier water tub  Call if you haven't heard something about the CPAP in the next 2-3 weeks  We discussed how untreated sleep apnea puts an individual at risk for cardiac arrhthymias, pulm HTN, DM, stroke and increases their risk for daytime accidents. We also briefly reviewed treatment options including weight loss, side sleeping position, oral appliance, CPAP therapy or referral to ENT for possible surgical options  Follow up 1/8 at 9 am in office with Katie Taralyn Ferraiolo,NP. If symptoms do not improve or worsen, please contact office for sooner follow up

## 2023-11-08 NOTE — Assessment & Plan Note (Signed)
 Moderate OSA. Reviewed risks of untreated OSA and potential treatment options. Given severity, recommendation was made for CPAP therapy. Orders placed for new start auto CPAP 5-15 cmH2O, mask of choice and heated humidity. Educated on proper use/care of device. Risks/benefits reviewed. Healthy weight loss encouraged.  Patient Instructions  Start CPAP every night, minimum of 4-6 hours a night.  Change equipment as directed. Wash your tubing with warm soap and water daily, hang to dry. Wash humidifier portion weekly. Use bottled, distilled water and change daily Be aware of reduced alertness and do not drive or operate heavy machinery if experiencing this or drowsiness.  Exercise encouraged, as tolerated. Healthy weight management discussed.  Avoid or decrease alcohol consumption and medications that make you more sleepy, if possible. Notify if persistent daytime sleepiness occurs even with consistent use of PAP therapy.  Change CPAP supplies... Every month Mask cushions and/or nasal pillows CPAP machine filters Every 3 months Mask frame (not including the headgear) CPAP tubing Every 6 months Mask headgear Chin strap (if applicable) Humidifier water tub  Call if you haven't heard something about the CPAP in the next 2-3 weeks  We discussed how untreated sleep apnea puts an individual at risk for cardiac arrhthymias, pulm HTN, DM, stroke and increases their risk for daytime accidents. We also briefly reviewed treatment options including weight loss, side sleeping position, oral appliance, CPAP therapy or referral to ENT for possible surgical options  Follow up 1/8 at 9 am in office with Katie Berania Peedin,NP. If symptoms do not improve or worsen, please contact office for sooner follow up

## 2023-11-08 NOTE — Assessment & Plan Note (Signed)
 See above. Continue with healthy weight management. Would be an appropriate candidate for GLP 1 therapy with Zepbound in setting of OSA. Advised to discuss with weight management provider and notify if she would like our assistance with prescribing this.

## 2023-12-12 ENCOUNTER — Ambulatory Visit (INDEPENDENT_AMBULATORY_CARE_PROVIDER_SITE_OTHER): Admitting: Family Medicine

## 2024-01-09 ENCOUNTER — Ambulatory Visit: Admitting: Nurse Practitioner

## 2024-01-15 ENCOUNTER — Other Ambulatory Visit: Payer: Self-pay | Admitting: Thoracic Surgery (Cardiothoracic Vascular Surgery)

## 2024-01-15 DIAGNOSIS — M898X8 Other specified disorders of bone, other site: Secondary | ICD-10-CM

## 2024-01-16 ENCOUNTER — Encounter (INDEPENDENT_AMBULATORY_CARE_PROVIDER_SITE_OTHER): Payer: Self-pay | Admitting: Family Medicine

## 2024-01-16 ENCOUNTER — Ambulatory Visit (INDEPENDENT_AMBULATORY_CARE_PROVIDER_SITE_OTHER): Admitting: Family Medicine

## 2024-01-16 ENCOUNTER — Other Ambulatory Visit: Payer: Self-pay | Admitting: Obstetrics and Gynecology

## 2024-01-16 VITALS — BP 105/69 | HR 72 | Temp 97.7°F | Ht 67.0 in | Wt 215.0 lb

## 2024-01-16 DIAGNOSIS — E669 Obesity, unspecified: Secondary | ICD-10-CM

## 2024-01-16 DIAGNOSIS — Z6834 Body mass index (BMI) 34.0-34.9, adult: Secondary | ICD-10-CM

## 2024-01-16 DIAGNOSIS — G4733 Obstructive sleep apnea (adult) (pediatric): Secondary | ICD-10-CM | POA: Diagnosis not present

## 2024-01-16 DIAGNOSIS — Z6833 Body mass index (BMI) 33.0-33.9, adult: Secondary | ICD-10-CM | POA: Diagnosis not present

## 2024-01-16 DIAGNOSIS — E88819 Insulin resistance, unspecified: Secondary | ICD-10-CM | POA: Diagnosis not present

## 2024-01-16 DIAGNOSIS — F5089 Other specified eating disorder: Secondary | ICD-10-CM | POA: Diagnosis not present

## 2024-01-16 DIAGNOSIS — N6489 Other specified disorders of breast: Secondary | ICD-10-CM

## 2024-01-16 NOTE — Progress Notes (Signed)
 "  Jenna Chapman, D.O.  ABFM, ABOM Specializing in Clinical Bariatric Medicine  Office located at: [address] [place], [place]  [address]          A) FOR THE CHRONIC DISEASE OF OBESITY:  Chief complaint: Obesity Jenna Chapman is here to discuss her progress with her obesity treatment plan.   History of present illness / Interval history:  Jenna Chapman is here today for her follow-up office visit.  Since last OV on 11/07/23, pt is up 2 lbs. Patient states that she fell off the wagon and trying to get back on.   11/07/23 10:00 01/16/24 15:00   Body Fat % 44.3 % 44.3 %  Muscle Mass (lbs) 112.6 lbs 114 lbs  Fat Mass (lbs) 94.4 lbs 95.6 lbs  Total Body Water (lbs) 77.6 lbs 78.4 lbs  Visceral Fat Rating  11 12   Counseling done on how various foods will affect these numbers and how to maximize success.  Total lbs lost to date: - 22 lbs Total Fat Mass in lbs lost to date: - 17 lbs Total weight loss percentage to date: - 9.28 %    Obesity, Beginning BMI 37.2 BMI 34.0-34.9,adult current 33.67 Nutrition Therapy She is Journaling 1200 calories and 90++ g protein  and states she is following her eating plan approximately 60 % of the time.   - Tracking Calories/Macros: no  - Eating More Whole Foods: yes  - Adequate Protein Intake: yes  - Adequate Water Intake: yes  - Skipping Meals: yes  - Sleeping 7-9 Hours/ Night: yes   Jenna Chapman is currently in the action stage of change. As such, her goal is to continue weight management plan.  She has agreed to: Journal 1200 calories and 85 + g protein with Cat 2 MP as guide   Physical Activity [name] is not exercising.   [name] has been advised to work up to 300-450 minutes of moderate intensity aerobic activity a week and strengthening exercises 2-3 times per week for cardiovascular health, weight loss maintenance and preservation of muscle mass.  She has agreed to : Think about enjoyable ways to increase daily physical  activity and overcoming barriers to exercise and Combine aerobic and strengthening exercises for efficiency and improved cardiometabolic health.   Behavioral Modifications Evidence-based interventions for health behavior change were utilized today including the discussion of   1) problem-solving barriers:    - Pt desires to Restart journaling  3) self care:    - Continue seeing counselor   4) SMART goals for next OV:    - Create a supportive home environment   Regarding patient's less desirable eating habits and patterns, we employed the technique of small changes.   We discussed the following today: work on tracking and journaling calories using tracking application and focusing on food with a 10:1 ratio of calories: grams of protein Additional resources provided today: Handout on CAT 2 meal plan and Handout on Daily Food Journaling Log   Medical Interventions/ Pharmacotherapy Previous Bariatric surgery: n/a Pharmacotherapy for weight loss: She is currently taking Wellbutrin XL 300 mg daily for medical weight loss.    We discussed various medication options to help Jenna Chapman with her weight loss efforts and we both agreed to : Adequate clinical response to anti-obesity medication, continue current anti-obesity regimen and Was educated on available treatment options, including pharmacotherapy with emphasis on potential side effects and other available medical interventions   B) OBESITY RELATED CONDITIONS ADDRESSED TODAY:  Insulin   resistance Assessment & Plan Lab Results  Component Value Date   HGBA1C 5.3 06/20/2023   HGBA1C 5.2 06/07/2016   INSULIN  12.4 06/20/2023    Diet and life style controlled. Patient asked today about oral Wegovy and the difference between the oral and the injectable. Explained and educated patient on that and direct her to the [employer] as well as the [employer] [facility]. Explained to patient the correlation between simple carbs and insulin  and how that  causes a rise in the cravings signal. Continue following prudent meal plan.     Other Specified Feeding or Eating Disorder, Emotional Eating Behaviors Assessment & Plan On Wellbutrin XL 300 mg daily. With reported good compliance and tolerance. Patient states that she has had some cravings for sweets and chips. She endorses going to counseling back in November but has not made a follow up appointment with them since then. Will continue wellbutrin at current dose (per PCP/ specialist who is filling it).  Recommended that patient reach out to counselor and make another appointment with them.     OSA (obstructive sleep apnea) Assessment & Plan Patient states that she was seen by [facility] pulmonology about a month ago. She had an AHI of 19/H and an SPO2 low 78%. She reported using her CPAP machine every night since then. She states that the NP Cobb told her that if she did not loose weight she would need to be placed on weight loss medication to help with her sleep apnea. We reached out to [facility] today in office. Cobb states that she no longer worked for [employer] pulmonology. Recommended that patient explore medications at her next OV with pulmonology for OSA. Pt understands she does not have coverage for GLP-1 / GIP for wt loss and is not able to do self-pay for it.  Will follow alongside PCP/specialist.     Follow up:   Return 01/29/2024 at 3:20 PM  She was informed of the importance of frequent follow up visits to maximize her success with intensive lifestyle modifications for her multiple health conditions.   Weight Summary and Biometrics   Weight Lost Since Last Visit: 0lb  Weight Gained Since Last Visit: 2lb   Vitals Temp: 97.7 F (36.5 C) BP: 105/69 Pulse Rate: 72 SpO2: 98 %   Anthropometric Measurements Height: 5' 7 (1.702 m) Weight: 215 lb (97.5 kg) BMI (Calculated): 33.67 Weight at Last Visit: 213lb Weight Lost Since Last Visit: 0lb Weight Gained Since Last Visit:  2lb Starting Weight: 237lb Total Weight Loss (lbs): 22 lb (9.979 kg)   Body Composition  Body Fat %: 44.3 % Fat Mass (lbs): 95.6 lbs Muscle Mass (lbs): 114 lbs Total Body Water (lbs): 78.4 lbs Visceral Fat Rating : 12   Other Clinical Data Fasting: no Labs: no Today's Visit #: 06/20/23    Objective:   PHYSICAL EXAM: Blood pressure 105/69, pulse 72, temperature 97.7 F (36.5 C), height 5' 7 (1.702 m), weight 215 lb (97.5 kg), SpO2 98%. Body mass index is 33.67 kg/m.  General: she is overweight, cooperative and in no acute distress. PSYCH: Has normal mood, affect and thought process.   HEENT: EOMI, sclerae are anicteric. Lungs: Normal breathing effort, no conversational dyspnea. Extremities: Moves * 4 Neurologic: A and O * 3, good insight  DIAGNOSTIC DATA REVIEWED: BMET    Component Value Date/Time   NA 139 06/20/2023 0943   K 4.6 06/20/2023 0943   CL 101 06/20/2023 0943   CO2 22 06/20/2023 0943   GLUCOSE  87 06/20/2023 0943   GLUCOSE 90 06/24/2014 1928   BUN 17 06/20/2023 0943   CREATININE 0.82 06/20/2023 0943   CREATININE 0.84 06/24/2014 1928   CALCIUM 9.5 06/20/2023 0943   GFRNONAA 89 06/07/2016 0817   GFRAA 102 06/07/2016 0817   Lab Results  Component Value Date   HGBA1C 5.3 06/20/2023   HGBA1C 5.2 06/07/2016   Lab Results  Component Value Date   INSULIN  12.4 06/20/2023   Lab Results  Component Value Date   TSH 1.770 06/20/2023   CBC    Component Value Date/Time   WBC 4.6 06/20/2023 0943   WBC 6.7 03/22/2023 0838   RBC 4.24 06/20/2023 0943   RBC 4.06 03/22/2023 0838   HGB 13.0 06/20/2023 0943   HCT 39.8 06/20/2023 0943   PLT 220 06/20/2023 0943   MCV 94 06/20/2023 0943   MCH 30.7 06/20/2023 0943   MCH 30.5 03/22/2023 0838   MCHC 32.7 06/20/2023 0943   MCHC 32.9 03/22/2023 0838   RDW 12.1 06/20/2023 0943   Iron Studies No results found for: IRON, TIBC, FERRITIN, IRONPCTSAT Lipid Panel     Component Value Date/Time   CHOL  167 06/20/2023 0943   TRIG 76 06/20/2023 0943   HDL 52 06/20/2023 0943   CHOLHDL 3.2 06/20/2023 0943   LDLCALC 101 (H) 06/20/2023 0943   Hepatic Function Panel     Component Value Date/Time   PROT 7.0 06/20/2023 0943   ALBUMIN 4.3 06/20/2023 0943   AST 31 06/20/2023 0943   ALT 34 (H) 06/20/2023 0943   ALKPHOS 92 06/20/2023 0943   BILITOT 0.6 06/20/2023 0943      Component Value Date/Time   TSH 1.770 06/20/2023 0943   Nutritional Lab Results  Component Value Date   VD25OH 45.8 06/20/2023    Attestations:   LILLETTE Sonny Laroche, acting as a stage manager for Jenna Jenkins, DO., have compiled all relevant documentation for today's office visit on behalf of Jenna Jenkins, DO, while in the presence of [employer], DO.  I have reviewed the above documentation for accuracy and completeness, and I agree with the above. Jenna JINNY Chapman, D.O.  The 21st Century Cures Act was signed into law in 2016 which includes the topic of electronic health records.  This provides immediate access to information in MyChart.  This includes consultation notes, operative notes, office notes, lab results and pathology reports.  If you have any questions about what you read please let us  know at your next visit so we can discuss your concerns and take corrective action if need be.  We are right here with you.  "

## 2024-01-23 ENCOUNTER — Ambulatory Visit: Admitting: Adult Health

## 2024-01-23 ENCOUNTER — Encounter: Payer: Self-pay | Admitting: Adult Health

## 2024-01-23 VITALS — BP 130/78 | HR 69 | Temp 97.7°F | Ht 67.0 in | Wt 221.2 lb

## 2024-01-23 DIAGNOSIS — J439 Emphysema, unspecified: Secondary | ICD-10-CM

## 2024-01-23 DIAGNOSIS — Z6834 Body mass index (BMI) 34.0-34.9, adult: Secondary | ICD-10-CM

## 2024-01-23 DIAGNOSIS — J479 Bronchiectasis, uncomplicated: Secondary | ICD-10-CM | POA: Diagnosis not present

## 2024-01-23 DIAGNOSIS — D169 Benign neoplasm of bone and articular cartilage, unspecified: Secondary | ICD-10-CM

## 2024-01-23 DIAGNOSIS — E669 Obesity, unspecified: Secondary | ICD-10-CM | POA: Diagnosis not present

## 2024-01-23 DIAGNOSIS — G4733 Obstructive sleep apnea (adult) (pediatric): Secondary | ICD-10-CM

## 2024-01-23 DIAGNOSIS — J432 Centrilobular emphysema: Secondary | ICD-10-CM

## 2024-01-23 DIAGNOSIS — Z87891 Personal history of nicotine dependence: Secondary | ICD-10-CM | POA: Diagnosis not present

## 2024-01-23 MED ORDER — ZEPBOUND 2.5 MG/0.5ML ~~LOC~~ SOAJ: 2.5000 mg | SUBCUTANEOUS | 0 refills | Status: AC

## 2024-01-23 NOTE — Progress Notes (Signed)
 "  @Patient  ID: Jenna Chapman, female    DOB: 04-Aug-1970, 54 y.o.   MRN: [id number]  Chief Complaint  Patient presents with   Medical Management of Chronic Issues    OSA    Referring provider: Rosalea Rosina SAILOR, PA  HPI: 54 yo female former smoker followed for COPD with emphysema and OSA    TEST/EVENTS : Reviewed 01/23/2024  10/01/2023 HST: AHI 19/h, SpO2 low 78%  PFT 08/15/2023 Essentially normal, FEV1 78%, ratio 78, FVC 78%, DLCO 75%   Discussed the use of AI scribe software for clinical note transcription with the patient, who gave verbal consent to proceed.  History of Present Illness Jenna Chapman is a 54 year old female with COPD and sleep apnea who presents for follow-up care.  She has a history of COPD, currently managed with Stiolto. She reports that her breathing is good, although she is not very active. She has a significant smoking history but has successfully quit smoking.  Pulmonary function testing in August 2025 showed no significant airflow obstruction or restriction.  Minimally decreased diffusing capacity.  She works as a social research officer, government at [employer], which involves some physical activity. Her last CT chest scan in July 2025 showed mild apical scarring, mild emphysema, bronchiectasis.  Scattered airspace opacities bilaterally.  Patient has moderate sleep apnea recently started on CPAP therapy.  Says she is wearing her CPAP every single night.  Typically gets in about 7 hours to use it.  CPAP download shows excellent compliance with 100% usage.  Daily average usage at 7 hours.  On auto CPAP 5 to 15 cm H2O.  AHI 1.8/hour . Previously, she had difficulty sleeping and would wake up for several hours at night, but this has improved with CPAP use.  Does feel that she is benefiting from CPAP.  Continues to have ongoing difficulties losing weight.  Has previously been seen at weight management.. She previously used Ozempic, losing about twenty pounds, but regained  weight after discontinuing it.  Would like to be evaluated for Zepbound  with the treatment of obesity and sleep apnea  No personal or family history of thyroid cancer, severe liver disease, pancreas disease, gallbladder disease, or inflammatory bowel disease. She is not pregnant. She maintains regular hydration and has routine blood work with her primary doctor.     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 8 g 11   buPROPion (WELLBUTRIN XL) 300 MG 24 hr tablet Take 300 mg by mouth daily.     citalopram (CELEXA) 20 MG tablet Take 20 mg by mouth daily.     estradiol (ESTRACE) 2 MG tablet Take 2 mg by mouth daily.  11   methenamine (HIPREX) 1 g tablet Take 1 g by mouth 2 (two) times daily with a meal.     pantoprazole (PROTONIX) 20 MG tablet Take 20 mg by mouth  daily.     Tiotropium Bromide-Olodaterol (STIOLTO RESPIMAT ) 2.5-2.5 MCG/ACT AERS Inhale 2 puffs into the lungs daily. 1 each 11   No facility-administered medications prior to visit.     Review of Systems:   Constitutional:   No  weight loss, night sweats,  Fevers, chills, fatigue, or  lassitude.  HEENT:   No headaches,  Difficulty swallowing,  Tooth/dental problems, or  Sore throat,                No sneezing, itching, ear ache, nasal congestion, post nasal drip,   CV:  No chest pain,  Orthopnea, PND, swelling in lower  extremities, anasarca, dizziness, palpitations, syncope.   GI  No heartburn, indigestion, abdominal pain, nausea, vomiting, diarrhea, change in bowel habits, loss of appetite, bloody stools.   Resp: No shortness of breath with exertion or at rest.  No excess mucus, no productive cough,  No non-productive cough,  No coughing up of blood.  No change in color of mucus.  No wheezing.  No chest wall deformity  Skin: no rash or lesions.  GU: no dysuria, change in color of urine, no urgency or frequency.  No flank pain, no hematuria   MS:  No joint pain or swelling.  No decreased range of motion.  No back pain.    Physical Exam  BP 130/78   Pulse 69   Temp 97.7 F (36.5 C)   Ht 5' 7 (1.702 m) Comment: Per pt  Wt 221 lb 3.2 oz (100.3 kg)   SpO2 98% Comment: RA  BMI 34.64 kg/m   GEN: A/Ox3; pleasant , NAD, well nourished    HEENT:  [place]/AT,   , NOSE-clear, THROAT-clear, no lesions, no postnasal drip or exudate noted.   NECK:  Supple w/ fair ROM; no JVD; normal carotid impulses w/o bruits; no thyromegaly or nodules palpated; no lymphadenopathy.    RESP  Clear  P & A; w/o, wheezes/ rales/ or rhonchi. no accessory muscle use, no dullness to percussion  CARD:  RRR, no m/r/g, no peripheral edema, pulses intact, no cyanosis or clubbing.  GI:   Soft & nt; nml bowel sounds; no organomegaly or masses detected.   Musco: Warm bil, no deformities or joint swelling noted.   Neuro: alert, no focal deficits noted.    Skin: Warm, no lesions or rashes    Lab Results:Reviewed 01/23/2024   CBC    Component Value Date/Time   WBC 4.6 06/20/2023 0943   WBC 6.7 03/22/2023 0838   RBC 4.24 06/20/2023 0943   RBC 4.06 03/22/2023 0838   HGB 13.0 06/20/2023 0943   HCT 39.8 06/20/2023 0943   PLT 220 06/20/2023 0943   MCV 94 06/20/2023 0943   MCH 30.7 06/20/2023 0943   MCH 30.5 03/22/2023 0838   MCHC 32.7 06/20/2023 0943   MCHC 32.9 03/22/2023 0838   RDW 12.1 06/20/2023 0943   LYMPHSABS 1.5  06/20/2023 0943   MONOABS 0.6 06/27/2012 1642   EOSABS 0.2 06/20/2023 0943   BASOSABS 0.0 06/20/2023 0943    BMET    Component Value Date/Time   NA 139 06/20/2023 0943   K 4.6 06/20/2023 0943   CL 101 06/20/2023 0943   CO2 22 06/20/2023 0943   GLUCOSE 87 06/20/2023 0943   GLUCOSE 90 06/24/2014 1928   BUN 17 06/20/2023 0943   CREATININE 0.82 06/20/2023 0943   CREATININE 0.84 06/24/2014 1928   CALCIUM 9.5 06/20/2023 0943   GFRNONAA 89 06/07/2016 0817   GFRAA 102  06/07/2016 0817    BNP No results found for: BNP  ProBNP No results found for: PROBNP  Imaging: No results found.  Administration History     None          Latest Ref Rng & Units 08/15/2023    8:31 AM  PFT Results  FVC-Pre L 3.02   FVC-Predicted Pre % 78   FVC-Post L 2.22   FVC-Predicted Post % 57   Pre FEV1/FVC % % 78   Post FEV1/FCV % % 80   FEV1-Pre L 2.37   FEV1-Predicted Pre % 78   FEV1-Post L 1.78   DLCO uncorrected ml/min/mmHg 17.31   DLCO UNC% % 75   DLCO corrected ml/min/mmHg 17.53   DLCO COR %Predicted % 76   DLVA Predicted % 91   TLC L 5.26   TLC % Predicted % 95   RV % Predicted % 109     No results found for: NITRICOXIDE      No data to display              Assessment & Plan:   Assessment and Plan Assessment & Plan Chronic obstructive pulmonary disease with emphysema   Mild COPD with emphysema l  Breathing is well-managed with Stiolto, . She is encouraged to increase physical activity to improve endurance and activity tolerance. Continue Stiolto inhaler, encourage daily purposeful activity starting with 5 minutes and building up to 30 minutes, and monitor lung function and symptoms.  Obstructive sleep apnea  -moderate with excellent control and compliance on CPAP  She experiences better sleep quality and increased duration. Discussed potential for weight loss to improve sleep apnea and possibly reduce CPAP dependency. Continue CPAP therapy at bedtime, encourage  weight loss and increased physical activity, and consider a repeat sleep study if weight loss is achieved.  Mild Bronchiectasis  -notable changes on CT imaging July 2025 Chronic bronchiectasis with previous CT scans showing bronchial wall thickening. No current symptoms suggest acute infection. Monitoring for changes in lung function and potential infections. Continue to monitor lung function and symptoms and review upcoming CT scan results.  Obesity  -morbid obesity with BMI at 34 with ongoing difficulties losing weight. BMI over 30. Discussed potential use of Zepbound , a GLP-1 medication, for weight management. Previous use of Ozempic resulted in weight loss but weight was regained after discontinuation. Emphasized the importance of diet, exercise, and medication adherence. Explained potential side effects and the need for insurance approval. Prescribed Zepbound  2.5 mg weekly injection, encouraged a healthy diet and regular exercise, instructed to monitor for side effects and adjust treatment as needed, ordered kidney and liver function tests, and scheduled a virtual follow-up in 4 weeks to assess response to Zepbound . Patient has moderate sleep apnea related to obesity with BMI >/30 or is overweight , posing significant cardiovascular risks. Patient has failed traditional weight loss measures with caloric deficit and consistent exercise of 150 min/week for >/6 months. Patient will be initiated on Zepbound  (tirzepatide ) for weight management. Zepbound  is the only pharmaceutical treatment approved for moderate-to-severe OSA in adults who are overweight (BMI >/27) or obese (BMI >/30). The patient will continue lifestyle modifications, including structured nutrition and physical activity as directed. No other GLP1 therapy will be used simultaneously at this time. The patient does not have any FDA labeled contraindications to this agent, including pregnancy, lactation, hx or family history of medullary thyroid  cancer, or multiple endocrine neoplasia type II. Side effect profile has been reviewed with patient. Aware of  red flag symptoms to notify of immediately or seek emergency care, including severe nausea/vomiting, inability to pass bowels or gas, severe abdominal pain/tenderness, jaundice.    Sternal mass-under surveillance imaging with thoracic surgery. CT-guided needle biopsy, which was consistent with an osteochondroma         Madelin Stank, NP 01/23/2024  I spent 40   minutes dedicated to the care of this patient on the date of this encounter to include pre-visit review of records, face-to-face time with the patient discussing conditions above, post visit ordering of testing, clinical documentation with the electronic health record, making appropriate referrals as documented, and communicating necessary findings to members of the patients care team.      [1] No Known Allergies [2]  Social History Tobacco Use  Smoking Status Former   Current packs/day: 0.00   Average packs/day: 3.0 packs/day for 32.0 years (96.0 ttl pk-yrs)   Types: Cigarettes   Start date: 26   Quit date: 2017   Years since quitting: 9.0  Smokeless Tobacco Never  Tobacco Comments   Quit in May 2017   "

## 2024-01-23 NOTE — Patient Instructions (Signed)
 Continue on Stiolto 2  puffs daily  Albuterol  inhaler As needed    Continue on CPAP At bedtime   Keep up great job  Begin Zepbound  2.5mg  weekly.  Healthy diet and exercise   Notify your provider if you are planning to have a procedure/surgery, as this medication will need to be stopped prior.

## 2024-01-29 ENCOUNTER — Encounter (INDEPENDENT_AMBULATORY_CARE_PROVIDER_SITE_OTHER): Payer: Self-pay | Admitting: Family Medicine

## 2024-01-29 ENCOUNTER — Ambulatory Visit (INDEPENDENT_AMBULATORY_CARE_PROVIDER_SITE_OTHER): Admitting: Family Medicine

## 2024-01-29 VITALS — BP 123/76 | HR 77 | Temp 97.7°F | Ht 67.0 in | Wt 218.0 lb

## 2024-01-29 DIAGNOSIS — E669 Obesity, unspecified: Secondary | ICD-10-CM

## 2024-01-29 DIAGNOSIS — F5089 Other specified eating disorder: Secondary | ICD-10-CM | POA: Diagnosis not present

## 2024-01-29 DIAGNOSIS — G4733 Obstructive sleep apnea (adult) (pediatric): Secondary | ICD-10-CM

## 2024-01-29 DIAGNOSIS — Z6834 Body mass index (BMI) 34.0-34.9, adult: Secondary | ICD-10-CM

## 2024-01-29 DIAGNOSIS — E88819 Insulin resistance, unspecified: Secondary | ICD-10-CM | POA: Diagnosis not present

## 2024-01-29 NOTE — Progress Notes (Signed)
 "  Jenna Chapman, D.O.  ABFM, AB[facility] Specializing in Clinical Bariatric Medicine  Office located at: [address] [place], [place]  [address]          FOR THE CHRONIC DISEASE OF OBESITY: Obesity, Beginning BMI 37.2 BMI 34.0-34.9,adult current 34.14  Chief complaint: Obesity Jenna Chapman is here to discuss her progress with her obesity treatment plan.   History of present illness / Interval history:  Jenna Chapman is here today for her follow-up office visit.  Since last OV on 01/16/2024 with provider: Dr. Jenkins, patient is up 3 lbs.  Pt reports no concerns with to MP --> good adherence to reduced calorie nutritional plan.  Pt has been struggling with:  []  meal planning and prepping []  exercise []  sleep []  stressors []  mood []  chronic or acute medical conditions-  [x]  eating out more []  Nothing, doing great  Pt has been working on and improving their:  []  meal planning and prepping []  exercise [x]  sleep hygiene []  water intake []  strategies to better manage personal stressors []  strategies to better manage mood []  eating out less []  Nothing particular at this time  When asked by CMA prior to our office visit today, pt states they have been:  - Focused on eating fresh, unprocessed foods?  Yes   - Focused on eating lean proteins with each meal?  Yes   - Sleeping 7-9 Hours/ Night?  Yes   - Skipping Meals? Yes - States she is following her healthy eating plan approximately 75% of the time.   Recent weight loss data history  01/16/24 15:00 01/29/24 15:00   Body Fat % 44.3 % 44.7 %  Muscle Mass (lbs) 114 lbs 114.6 lbs  Fat Mass (lbs) 95.6 lbs 97.8 lbs  Total Body Water (lbs) 78.4 lbs 79.4 lbs  Visceral Fat Rating  12 12     Counseling done on how various foods/ behaviors will affect these numbers and how to maximize weight loss success discussed today in detail based on these findings. Total lbs lost to date: -19 lbs Total Fat Mass in lbs lost to date:  -14.8 lbs Total weight loss percentage to date since starting program:  -8.02 %    Physician directed Nutrition Therapy prescription: She is journaling 1200 calories and 85 + g protein with Cat 2 MP as guide.    Jenna Chapman is currently in the action stage of change. As such, her goal is to continue weight management plan.   She has agreed to: continue current reduced-calorie meal plan    Physician directed Behavioral Modification prescription: Evidence-based interventions for healthy behavior change were utilized today including the discussion of small changes and SMART goals.  We discussed the following today: increasing lean protein intake to established goals, avoiding skipping meals, work on tracking and journaling calories using tracking application, continue to work on implementation of reduced calorie nutritional plan, and continue to practice mindfulness when eating  Additional resources provided: None   Physician directed Physical Activity prescription: Pt  is walking or going to the gym 30  minutes 4 days per week  Exercise prescription: She has agreed to Think about enjoyable ways to increase daily physical activity and overcoming barriers to exercise, Continue current level of physical activity , Increase the intensity, frequency or duration of strengthening exercises , and Increase the intensity, frequency or duration of aerobic exercises      Medical Interventions/ Pharmacotherapy Previous Bariatric surgery: none   Pharmacotherapy for weight loss: She  is currently taking Wellbutrin XL for medical weight loss as prescribed by PCP/specialists.    We discussed various medication options to help Jenna Chapman with her weight loss efforts and we both agreed to:  Continue with current nutritional and behavioral strategies  SPECIFIC behavorial / nutritional / exercise goals for next office visit:  plans to increase exercise.  B) OBESITY RELATED CONDITIONS ADDRESSED  TODAY:   Insulin  resistance Assessment & Plan Lab Results  Component Value Date   HGBA1C 5.3 06/20/2023   HGBA1C 5.2 06/07/2016   INSULIN  12.4 06/20/2023  Condition is stable. Managed by dietary and lifestyle interventions. Hunger and cravings are overall well controlled and pt reports no concerns. Cont adherence to prudent nutritional meal plan. Increase exercise as able.     Other Specified Feeding or Eating Disorder, Emotional Eating Behaviors Assessment & Plan Currently on Wellbutrin XL with good compliance and tolerance. PCP increased dose to 300 mg; pt reports she has been feeling more on edge and more emotional recently. Encouraged pt to follow up with PCP about possibly decreasing dose. She has obtained a counselor recently and has a had a couple of appointments. Cont medication per PCP/specialists. Cont to implement self- management strategies. Cont to follow up with counselor as needed.     OSA (obstructive sleep apnea) Assessment & Plan Read recent pulmonary note with pt from 01/23/2024 with pt which appears to be for OSA. Condition is well controlled. Reports good compliance to CPAP. Since last OV, pt has explored medications with pulmonology for OSA. Pulmonary wrote for Zepbound , however pt has not received medication yet and is waiting for insurance to approve medication. If it is not approved, encouraged pt to continue following up with pulmonology about a prior authorization of zepbound . Cont adherence to CPAP per pulm.       Follow up:   Return 02/27/2024 3:20 PM.   She was informed of the importance of frequent follow up visits to maximize her success with intensive lifestyle modifications for her multiple health conditions.    Weight Summary and Biometrics   Weight Lost Since Last Visit: 0lb  Weight Gained Since Last Visit: 3lb    Vitals Temp: 97.7 F (36.5 C) BP: 123/76 Pulse Rate: 77 SpO2: 98 %   Anthropometric Measurements Height: 5' 7 (1.702  m) Weight: 218 lb (98.9 kg) BMI (Calculated): 34.14 Weight at Last Visit: 215lb Weight Lost Since Last Visit: 0lb Weight Gained Since Last Visit: 3lb Starting Weight: 237lb Total Weight Loss (lbs): 19 lb (8.618 kg)   Body Composition  Body Fat %: 44.7 % Fat Mass (lbs): 97.8 lbs Muscle Mass (lbs): 114.6 lbs Total Body Water (lbs): 79.4 lbs Visceral Fat Rating : 12   Other Clinical Data Fasting: no Labs: no Today's Visit #: 7 Starting Date: 06/20/23    Objective:   PHYSICAL EXAM: Blood pressure 123/76, pulse 77, temperature 97.7 F (36.5 C), height 5' 7 (1.702 m), weight 218 lb (98.9 kg), SpO2 98%. Body mass index is 34.14 kg/m. General: she is overweight, cooperative and in no acute distress. PSYCH: Has normal mood, affect and thought process.   HEENT: EOMI, sclerae are anicteric. Lungs: Normal breathing effort, no conversational dyspnea. Extremities: Moves * 4 Neurologic: A and O * 3, good insight  DIAGNOSTIC DATA REVIEWED: BMET    Component Value Date/Time   NA 139 06/20/2023 0943   K 4.6 06/20/2023 0943   CL 101 06/20/2023 0943   CO2 22 06/20/2023 0943   GLUCOSE 87 06/20/2023 0943  GLUCOSE 90 06/24/2014 1928   BUN 17 06/20/2023 0943   CREATININE 0.82 06/20/2023 0943   CREATININE 0.84 06/24/2014 1928   CALCIUM 9.5 06/20/2023 0943   GFRNONAA 89 06/07/2016 0817   GFRAA 102 06/07/2016 0817   Lab Results  Component Value Date   HGBA1C 5.3 06/20/2023   HGBA1C 5.2 06/07/2016   Lab Results  Component Value Date   INSULIN  12.4 06/20/2023   Lab Results  Component Value Date   TSH 1.770 06/20/2023   CBC    Component Value Date/Time   WBC 4.6 06/20/2023 0943   WBC 6.7 03/22/2023 0838   RBC 4.24 06/20/2023 0943   RBC 4.06 03/22/2023 0838   HGB 13.0 06/20/2023 0943   HCT 39.8 06/20/2023 0943   PLT 220 06/20/2023 0943   MCV 94 06/20/2023 0943   MCH 30.7 06/20/2023 0943   MCH 30.5 03/22/2023 0838   MCHC 32.7 06/20/2023 0943   MCHC 32.9  03/22/2023 0838   RDW 12.1 06/20/2023 0943   Iron Studies No results found for: IRON, TIBC, FERRITIN, IRONPCTSAT Lipid Panel     Component Value Date/Time   CHOL 167 06/20/2023 0943   TRIG 76 06/20/2023 0943   HDL 52 06/20/2023 0943   CHOLHDL 3.2 06/20/2023 0943   LDLCALC 101 (H) 06/20/2023 0943   Hepatic Function Panel     Component Value Date/Time   PROT 7.0 06/20/2023 0943   ALBUMIN 4.3 06/20/2023 0943   AST 31 06/20/2023 0943   ALT 34 (H) 06/20/2023 0943   ALKPHOS 92 06/20/2023 0943   BILITOT 0.6 06/20/2023 0943      Component Value Date/Time   TSH 1.770 06/20/2023 0943   Nutritional Lab Results  Component Value Date   VD25OH 45.8 06/20/2023    Attestations:   LILLETTE Feliciano Mingle, acting as a stage manager for Jenna Jenkins, DO., have compiled all relevant documentation for today's office visit on behalf of Jenna Jenkins, DO, while in the presence of [employer], DO.   I have spent 40 minutes in the care of the patient including:   -   2 minutes before the visit reviewing and preparing the chart.   -   31 minutes face-to-face assessing and reviewing listed medical problems as outlined in obesity care plan, providing nutritional and behavioral counseling on topics outlined in the obesity care plan, independently interpreting test results and goals of care as described in assessment and plan, and reviewing and discussing biometric information and progress with obesity treatment plan  -   7 minutes after the visit regarding the EMR documentation of encounter.   I have reviewed the above documentation for accuracy and completeness, and I agree with the above. Jenna JINNY Chapman, D.O.  The 21st Century Cures Act was signed into law in 2016 which includes the topic of electronic health records.  This provides immediate access to information in MyChart.  This includes consultation notes, operative notes, office notes, lab results and pathology reports.  If you  have any questions about what you read please let us  know at your next visit so we can discuss your concerns and take corrective action if need be.  We are right here with you.  "

## 2024-02-03 ENCOUNTER — Ambulatory Visit (HOSPITAL_COMMUNITY)
Admission: RE | Admit: 2024-02-03 | Discharge: 2024-02-03 | Disposition: A | Discharge status: Home / Self Care | Source: Ambulatory Visit | Attending: Thoracic Surgery (Cardiothoracic Vascular Surgery) | Admitting: Thoracic Surgery (Cardiothoracic Vascular Surgery)

## 2024-02-03 DIAGNOSIS — M898X8 Other specified disorders of bone, other site: Secondary | ICD-10-CM

## 2024-02-06 ENCOUNTER — Ambulatory Visit
Admission: RE | Admit: 2024-02-06 | Discharge: 2024-02-06 | Disposition: A | Source: Ambulatory Visit | Attending: Obstetrics and Gynecology | Admitting: Obstetrics and Gynecology

## 2024-02-06 DIAGNOSIS — N6489 Other specified disorders of breast: Secondary | ICD-10-CM

## 2024-02-18 ENCOUNTER — Ambulatory Visit: Admitting: Thoracic Surgery (Cardiothoracic Vascular Surgery)

## 2024-02-27 ENCOUNTER — Ambulatory Visit (INDEPENDENT_AMBULATORY_CARE_PROVIDER_SITE_OTHER): Admitting: Family Medicine
